# Patient Record
Sex: Female | Born: 1955
Health system: Southern US, Community
[De-identification: ages and names within clinical notes are randomized; demographics above are authoritative.]

## PROBLEM LIST (undated history)

## (undated) DIAGNOSIS — E079 Disorder of thyroid, unspecified: Secondary | ICD-10-CM

## (undated) DIAGNOSIS — R61 Generalized hyperhidrosis: Secondary | ICD-10-CM

## (undated) DIAGNOSIS — K219 Gastro-esophageal reflux disease without esophagitis: Secondary | ICD-10-CM

## (undated) DIAGNOSIS — H269 Unspecified cataract: Secondary | ICD-10-CM

## (undated) DIAGNOSIS — R002 Palpitations: Secondary | ICD-10-CM

## (undated) DIAGNOSIS — I10 Essential (primary) hypertension: Secondary | ICD-10-CM

## (undated) DIAGNOSIS — M1990 Unspecified osteoarthritis, unspecified site: Secondary | ICD-10-CM

## (undated) DIAGNOSIS — E785 Hyperlipidemia, unspecified: Secondary | ICD-10-CM

## (undated) HISTORY — PX: CATARACT EXTRACTION: SUR2

## (undated) HISTORY — DX: Gastro-esophageal reflux disease without esophagitis: K21.9

## (undated) HISTORY — PX: COLONOSCOPY: SHX174

## (undated) HISTORY — DX: Disorder of thyroid, unspecified: E07.9

## (undated) HISTORY — PX: FOOT SURGERY: SHX648

## (undated) HISTORY — DX: Generalized hyperhidrosis: R61

## (undated) HISTORY — DX: Unspecified osteoarthritis, unspecified site: M19.90

## (undated) HISTORY — DX: Unspecified cataract: H26.9

## (undated) HISTORY — PX: LAPAROSCOPIC OOPHERECTOMY: SHX6507

## (undated) HISTORY — DX: Essential (primary) hypertension: I10

## (undated) HISTORY — DX: Palpitations: R00.2

## (undated) HISTORY — DX: Hyperlipidemia, unspecified: E78.5

---

## 1974-07-28 DIAGNOSIS — E079 Disorder of thyroid, unspecified: Secondary | ICD-10-CM

## 1974-07-28 HISTORY — DX: Disorder of thyroid, unspecified: E07.9

## 1997-11-08 ENCOUNTER — Other Ambulatory Visit: Admission: RE | Admit: 1997-11-08 | Discharge: 1997-11-08 | Payer: Self-pay | Admitting: Obstetrics and Gynecology

## 1997-11-17 ENCOUNTER — Ambulatory Visit (HOSPITAL_COMMUNITY): Admission: RE | Admit: 1997-11-17 | Discharge: 1997-11-17 | Payer: Self-pay | Admitting: Obstetrics and Gynecology

## 1998-11-21 ENCOUNTER — Encounter: Payer: Self-pay | Admitting: Obstetrics and Gynecology

## 1998-11-21 ENCOUNTER — Ambulatory Visit (HOSPITAL_COMMUNITY): Admission: RE | Admit: 1998-11-21 | Discharge: 1998-11-21 | Payer: Self-pay | Admitting: Obstetrics and Gynecology

## 1999-01-10 ENCOUNTER — Other Ambulatory Visit: Admission: RE | Admit: 1999-01-10 | Discharge: 1999-01-10 | Payer: Self-pay | Admitting: Obstetrics and Gynecology

## 1999-11-25 ENCOUNTER — Encounter: Payer: Self-pay | Admitting: Obstetrics and Gynecology

## 1999-11-25 ENCOUNTER — Ambulatory Visit (HOSPITAL_COMMUNITY): Admission: RE | Admit: 1999-11-25 | Discharge: 1999-11-25 | Payer: Self-pay | Admitting: Obstetrics and Gynecology

## 2000-02-14 ENCOUNTER — Other Ambulatory Visit: Admission: RE | Admit: 2000-02-14 | Discharge: 2000-02-14 | Payer: Self-pay | Admitting: Obstetrics and Gynecology

## 2000-12-01 ENCOUNTER — Encounter: Payer: Self-pay | Admitting: Obstetrics and Gynecology

## 2000-12-01 ENCOUNTER — Ambulatory Visit (HOSPITAL_COMMUNITY): Admission: RE | Admit: 2000-12-01 | Discharge: 2000-12-01 | Payer: Self-pay | Admitting: Obstetrics and Gynecology

## 2001-03-11 ENCOUNTER — Other Ambulatory Visit: Admission: RE | Admit: 2001-03-11 | Discharge: 2001-03-11 | Payer: Self-pay | Admitting: Obstetrics and Gynecology

## 2001-12-03 ENCOUNTER — Ambulatory Visit (HOSPITAL_COMMUNITY): Admission: RE | Admit: 2001-12-03 | Discharge: 2001-12-03 | Payer: Self-pay | Admitting: Obstetrics and Gynecology

## 2001-12-03 ENCOUNTER — Encounter: Payer: Self-pay | Admitting: Obstetrics and Gynecology

## 2002-04-25 ENCOUNTER — Other Ambulatory Visit: Admission: RE | Admit: 2002-04-25 | Discharge: 2002-04-25 | Payer: Self-pay | Admitting: Obstetrics and Gynecology

## 2002-12-12 ENCOUNTER — Encounter: Payer: Self-pay | Admitting: Obstetrics and Gynecology

## 2002-12-12 ENCOUNTER — Ambulatory Visit (HOSPITAL_COMMUNITY): Admission: RE | Admit: 2002-12-12 | Discharge: 2002-12-12 | Payer: Self-pay | Admitting: Obstetrics and Gynecology

## 2003-05-02 ENCOUNTER — Other Ambulatory Visit: Admission: RE | Admit: 2003-05-02 | Discharge: 2003-05-02 | Payer: Self-pay | Admitting: Obstetrics and Gynecology

## 2004-05-29 ENCOUNTER — Ambulatory Visit (HOSPITAL_COMMUNITY): Admission: RE | Admit: 2004-05-29 | Discharge: 2004-05-29 | Payer: Self-pay | Admitting: Obstetrics and Gynecology

## 2004-08-27 ENCOUNTER — Other Ambulatory Visit: Admission: RE | Admit: 2004-08-27 | Discharge: 2004-08-27 | Payer: Self-pay | Admitting: Obstetrics and Gynecology

## 2004-10-02 ENCOUNTER — Ambulatory Visit: Payer: Self-pay | Admitting: Internal Medicine

## 2004-10-18 ENCOUNTER — Ambulatory Visit: Payer: Self-pay | Admitting: Internal Medicine

## 2005-05-30 ENCOUNTER — Ambulatory Visit (HOSPITAL_COMMUNITY): Admission: RE | Admit: 2005-05-30 | Discharge: 2005-05-30 | Payer: Self-pay | Admitting: Obstetrics and Gynecology

## 2005-09-22 ENCOUNTER — Other Ambulatory Visit: Admission: RE | Admit: 2005-09-22 | Discharge: 2005-09-22 | Payer: Self-pay | Admitting: Obstetrics and Gynecology

## 2006-06-01 ENCOUNTER — Ambulatory Visit (HOSPITAL_COMMUNITY): Admission: RE | Admit: 2006-06-01 | Discharge: 2006-06-01 | Payer: Self-pay | Admitting: Obstetrics and Gynecology

## 2007-06-09 ENCOUNTER — Ambulatory Visit (HOSPITAL_COMMUNITY): Admission: RE | Admit: 2007-06-09 | Discharge: 2007-06-09 | Payer: Self-pay | Admitting: Obstetrics and Gynecology

## 2008-06-14 ENCOUNTER — Ambulatory Visit (HOSPITAL_COMMUNITY): Admission: RE | Admit: 2008-06-14 | Discharge: 2008-06-14 | Payer: Self-pay | Admitting: Obstetrics and Gynecology

## 2009-06-19 ENCOUNTER — Ambulatory Visit (HOSPITAL_COMMUNITY): Admission: RE | Admit: 2009-06-19 | Discharge: 2009-06-19 | Payer: Self-pay | Admitting: Obstetrics and Gynecology

## 2009-07-16 ENCOUNTER — Emergency Department (HOSPITAL_COMMUNITY): Admission: EM | Admit: 2009-07-16 | Discharge: 2009-07-16 | Payer: Self-pay | Admitting: Family Medicine

## 2009-10-03 ENCOUNTER — Encounter: Payer: Self-pay | Admitting: Internal Medicine

## 2010-01-04 ENCOUNTER — Encounter: Payer: Self-pay | Admitting: Internal Medicine

## 2010-01-17 ENCOUNTER — Telehealth (INDEPENDENT_AMBULATORY_CARE_PROVIDER_SITE_OTHER): Payer: Self-pay | Admitting: *Deleted

## 2010-01-17 ENCOUNTER — Telehealth: Payer: Self-pay | Admitting: Internal Medicine

## 2010-01-31 DIAGNOSIS — D1803 Hemangioma of intra-abdominal structures: Secondary | ICD-10-CM | POA: Insufficient documentation

## 2010-01-31 DIAGNOSIS — E039 Hypothyroidism, unspecified: Secondary | ICD-10-CM | POA: Insufficient documentation

## 2010-01-31 DIAGNOSIS — K5730 Diverticulosis of large intestine without perforation or abscess without bleeding: Secondary | ICD-10-CM | POA: Insufficient documentation

## 2010-01-31 DIAGNOSIS — D376 Neoplasm of uncertain behavior of liver, gallbladder and bile ducts: Secondary | ICD-10-CM

## 2010-02-06 ENCOUNTER — Ambulatory Visit: Payer: Self-pay | Admitting: Internal Medicine

## 2010-02-07 LAB — CONVERTED CEMR LAB
ALT: 16 units/L (ref 0–35)
Alkaline Phosphatase: 62 units/L (ref 39–117)
Bilirubin, Direct: 0.1 mg/dL (ref 0.0–0.3)
Total Protein: 6.8 g/dL (ref 6.0–8.3)

## 2010-02-08 ENCOUNTER — Ambulatory Visit: Payer: Self-pay | Admitting: Internal Medicine

## 2010-02-08 DIAGNOSIS — E538 Deficiency of other specified B group vitamins: Secondary | ICD-10-CM | POA: Insufficient documentation

## 2010-02-15 ENCOUNTER — Ambulatory Visit: Payer: Self-pay | Admitting: Internal Medicine

## 2010-02-25 ENCOUNTER — Ambulatory Visit: Payer: Self-pay | Admitting: Internal Medicine

## 2010-03-04 ENCOUNTER — Ambulatory Visit: Payer: Self-pay | Admitting: Internal Medicine

## 2010-03-28 ENCOUNTER — Telehealth: Payer: Self-pay | Admitting: Internal Medicine

## 2010-04-02 ENCOUNTER — Ambulatory Visit: Payer: Self-pay | Admitting: Internal Medicine

## 2010-04-05 ENCOUNTER — Ambulatory Visit (HOSPITAL_COMMUNITY): Admission: RE | Admit: 2010-04-05 | Discharge: 2010-04-05 | Payer: Self-pay | Admitting: Internal Medicine

## 2010-05-01 ENCOUNTER — Ambulatory Visit: Payer: Self-pay | Admitting: Internal Medicine

## 2010-06-05 ENCOUNTER — Ambulatory Visit: Payer: Self-pay | Admitting: Internal Medicine

## 2010-07-03 ENCOUNTER — Ambulatory Visit: Payer: Self-pay | Admitting: Internal Medicine

## 2010-07-03 ENCOUNTER — Ambulatory Visit (HOSPITAL_COMMUNITY)
Admission: RE | Admit: 2010-07-03 | Discharge: 2010-07-03 | Payer: Self-pay | Source: Home / Self Care | Attending: Obstetrics and Gynecology | Admitting: Obstetrics and Gynecology

## 2010-07-11 ENCOUNTER — Encounter
Admission: RE | Admit: 2010-07-11 | Discharge: 2010-07-11 | Payer: Self-pay | Source: Home / Self Care | Attending: Obstetrics and Gynecology | Admitting: Obstetrics and Gynecology

## 2010-07-12 ENCOUNTER — Encounter
Admission: RE | Admit: 2010-07-12 | Discharge: 2010-07-12 | Payer: Self-pay | Source: Home / Self Care | Attending: Obstetrics and Gynecology | Admitting: Obstetrics and Gynecology

## 2010-07-28 HISTORY — PX: BREAST EXCISIONAL BIOPSY: SUR124

## 2010-07-28 HISTORY — PX: BREAST BIOPSY: SHX20

## 2010-08-07 ENCOUNTER — Ambulatory Visit
Admission: RE | Admit: 2010-08-07 | Discharge: 2010-08-07 | Payer: Self-pay | Source: Home / Self Care | Attending: Internal Medicine | Admitting: Internal Medicine

## 2010-08-07 ENCOUNTER — Other Ambulatory Visit: Payer: Self-pay | Admitting: Internal Medicine

## 2010-08-07 LAB — VITAMIN B12: Vitamin B-12: 386 pg/mL (ref 211–911)

## 2010-08-16 ENCOUNTER — Ambulatory Visit
Admission: RE | Admit: 2010-08-16 | Discharge: 2010-08-16 | Payer: Self-pay | Source: Home / Self Care | Attending: General Surgery | Admitting: General Surgery

## 2010-08-16 ENCOUNTER — Encounter
Admission: RE | Admit: 2010-08-16 | Discharge: 2010-08-16 | Payer: Self-pay | Source: Home / Self Care | Attending: General Surgery | Admitting: General Surgery

## 2010-08-17 ENCOUNTER — Encounter: Payer: Self-pay | Admitting: Obstetrics and Gynecology

## 2010-08-17 NOTE — Op Note (Signed)
  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 Alan Mulder who underwent a screening mammogram on July 03, 2010, showed some left breast microcalcifications.  This area was of 3-mm cluster, a slightly heterogeneous calcifications with possibly a small mass associated with that.  She underwent evaluation with stereotactic biopsy with clip placement showing a focal atypical lobular hyperplasia, benign stromal fibrosis with microcalcifications.  She was then referred for surgical excision.  She and I and Dr. Ashley Royalty discussed a wire localization biopsy.  PROCEDURE:  After informed consent was obtained, the patient first was taken to the breast center.  She had a wire placed by Dr. Vincenza Hews.  She was then brought to [facility].  I had the mammograms available for my review prior to beginning.  She then was placed under monitored anesthesia care.  She underwent administration of 1 g of intravenous cefazolin.  Sequential compression devices were placed on lower extremities.  Her breast was then prepped and draped in standard sterile surgical fashion.  A surgical time-out was then performed.  I made a radial incision as the wire really tracked posterior and a little bit medial from where it went in the lower outer quadrant of her breast.  Cautery was then used to remove the entire lesion.  I did this, and essentially a cylinder around the wire and the clip.  There was a 3- mm focus of atypical lobular plasia.  This was a very small area, so I did not take a great deal of tissue around this due to her diagnosis at this point.   This was then passed off the table as a specimen.  It was marked with a short stitch superior, long stitch lateral, double stitch deep.  Faxitron mammogram confirmed removal of the clip.  Dr. Guinevere Ferrari discussed this with me.  One of the margins around the clip was about 8 mm, an I think that is perfectly adequate for what is a benign diagnosis, so we did not proceed with any other excision.  Hemostasis was observed.  Irrigation was performed.  I then closed the deep breast tissue with a 2-0 Vicryl.  The dermis was closed with 3-0 Vicryl.  The skin was closed with 4-0 Monocryl in a subcuticular fashion.  Steri- Strips and sterile dressing were placed.  I used a total of about 18 mL of 0.25% Marcaine mixed with 1% lidocaine during the procedure.  She tolerated this well, was transferred to the recovery room in stable condition.     Juanetta Gosling, MD     MCW/MEDQ  D:  08/16/2010  T:  08/17/2010  Job:  [id number]  cc:   Tera Mater. Evlyn Kanner, M.D.  Electronically Signed by Emelia Loron MD on 08/17/2010 01:59:15 PM

## 2010-08-19 LAB — BASIC METABOLIC PANEL
BUN: 15 mg/dL (ref 6–23)
CO2: 26 mEq/L (ref 19–32)
Chloride: 104 mEq/L (ref 96–112)
Creatinine, Ser: 0.84 mg/dL (ref 0.4–1.2)

## 2010-08-19 LAB — CBC
Hemoglobin: 14.3 g/dL (ref 12.0–15.0)
MCH: 31.7 pg (ref 26.0–34.0)
MCV: 94.5 fL (ref 78.0–100.0)
Platelets: 201 10*3/uL (ref 150–400)
RBC: 4.51 MIL/uL (ref 3.87–5.11)

## 2010-08-19 LAB — DIFFERENTIAL
Eosinophils Absolute: 0.1 10*3/uL (ref 0.0–0.7)
Lymphs Abs: 1.9 10*3/uL (ref 0.7–4.0)
Monocytes Absolute: 0.6 10*3/uL (ref 0.1–1.0)
Monocytes Relative: 8 % (ref 3–12)
Neutrophils Relative %: 66 % (ref 43–77)

## 2010-08-27 NOTE — Assessment & Plan Note (Signed)
Summary: b12 week 1 of 4/dn  Nurse Visit    Allergies: No Known Drug Allergies  Medication Administration  Injection # 1:    Medication: Vit B12 1000 mcg    Diagnosis: B12 DEFICIENCY (ICD-266.2)    Route: IM    Site: L deltoid    Exp Date: 10/2011    Lot #: [id number]    Mfr: [employer]    Patient tolerated injection without complications    Given by: Ok Anis CMA (February 08, 2010 1:42 PM)  Orders Added: 1)  Vit B12 1000 mcg [J3420]

## 2010-08-27 NOTE — Progress Notes (Signed)
Summary: F/U MRI FOR LIVER LESION  Phone Note Outgoing Call   Call placed by: Lamona Curl CMA Duncan Dull),  March 28, 2010 11:43 AM Call placed to: Patient Summary of Call: Called patient to advise her that I have scheduled her for a follow up MRI abdomen with and without Evista contrast (evista to be used per Dr Juanda Chance after conversation with Radiologist at patient's office visit). Patient has been set up to have MRI at [facility] radiology on 04-05-10 @ 9 am. She will come several days before that in order to have her BUN and Creatinine drawn.  Initial call taken by: Lamona Curl CMA (AAMA),  March 28, 2010 11:44 AM

## 2010-08-27 NOTE — Assessment & Plan Note (Signed)
Summary: MONTHLY B12 SHOT...LSW.  Nurse Visit   Allergies: No Known Drug Allergies  Medication Administration  Injection # 1:    Medication: Vit B12 1000 mcg    Diagnosis: B12 DEFICIENCY (ICD-266.2)    Route: IM    Site: L deltoid    Exp Date: 02/26/2012    Lot #: [id number]    Mfr: [employer]    Patient tolerated injection without complications    Given by: Christie Nottingham CMA Duncan Dull) (June 05, 2010 1:43 PM)  Orders Added: 1)  Vit B12 1000 mcg [J3420]

## 2010-08-27 NOTE — Assessment & Plan Note (Signed)
Summary: b12 week 3 of 4  Nurse Visit   Allergies: No Known Drug Allergies  Medication Administration  Injection # 1:    Medication: Vit B12 1000 mcg    Diagnosis: B12 DEFICIENCY (ICD-266.2)    Route: IM    Site: L deltoid    Exp Date: 12/2011    Lot #: [id number]    Mfr: [employer]    Comments: pt to schedule # 4 of 4 at the front desk    Patient tolerated injection without complications    Given by: Chales Abrahams CMA Duncan Dull) (February 25, 2010 3:40 PM)  Orders Added: 1)  Vit B12 1000 mcg [J3420]

## 2010-08-27 NOTE — Assessment & Plan Note (Signed)
Summary: Ref by Dr. Evlyn Kanner, sending recs/dfs   History of Present Illness Visit Type: Initial Visit Primary GI MD: Lina Sar MD Primary Provider: Adrian Prince, MD Chief Complaint: Results of MRI: large left hepatic lobe mass History of Present Illness:   This is a 55 year old white female with an asymptomatic mass in the left lobe of the liver measuring 5x3 cm surrounding the hepatic dome, consistent with small cysts. It showed on an MRI of the abdomen done for evaluation of abdominal pain after an upper abdominal ultrasound that showed an abnormality of the left lobe of the liver. The mass does not appear to be vascular. It does  not enhance. It is not typical of focal nodular hyperplasia or metastatic disease. It has sharply defined edges and appears somewhat lobular. Patient denies abdominal pain at this time. Her liver function tests have been normal. She is postmenopausal and is not taking any estrogen. She drinks 2-3 glasses of wine a week. Her weight has been stable. A screening colonoscopy in March 2006 showed mild diverticulosis of the left colon with no polyps. There is no family history of liver disease.   GI Review of Systems    Reports abdominal pain.      Denies acid reflux, belching, bloating, chest pain, dysphagia with liquids, dysphagia with solids, heartburn, loss of appetite, nausea, vomiting, vomiting blood, weight loss, and  weight gain.        Denies anal fissure, black tarry stools, change in bowel habit, diarrhea, diverticulosis, fecal incontinence, heme positive stool, hemorrhoids, irritable bowel syndrome, jaundice, light color stool, liver problems, rectal bleeding, and  rectal pain. Preventive Screening-Counseling & Management  Alcohol-Tobacco     Smoking Status: never    Current Medications (verified): 1)  Synthroid 100 Mcg Tabs (Levothyroxine Sodium) .... Take 1 Tablet By Mouth Once A Day 2)  Glucosamine-Chondroitin  Caps (Glucosamine-Chondroit-Vit C-Mn)  .... 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 C-Section Bilateral Tubal Ligation Ovarian Cyst Removal  Family History: No FH of Colon Cancer: Family History of Uterine Cancer:Mother Family History of Heart Disease: Father  Social History: Illicit Drug Use - no Occupation: Stay at home MOM Patient has never smoked.  Alcohol Use - yes Daily Caffeine Use 6 Smoking Status:  never  Review of Systems       The patient complains of arthritis/joint pain and hearing problems.  The patient denies allergy/sinus, anemia, anxiety-new, back pain, blood in urine, breast changes/lumps, change in vision, confusion, cough, coughing up blood, depression-new, fainting, fatigue, fever, headaches-new, heart murmur, heart rhythm changes, itching, menstrual pain, muscle pains/cramps, night sweats, nosebleeds, pregnancy symptoms, shortness of breath, skin rash, sleeping problems, sore throat, swelling of feet/legs, swollen lymph glands, thirst - excessive , urination - excessive , urination changes/pain, urine leakage, vision changes, and voice change.         Pertinent positive and negative review of systems were noted in the above HPI. All other ROS was otherwise negative.   Vital Signs:  Patient profile:   55 year old female Height:      66.5 inches Weight:      153 pounds BMI:     24.41 Pulse rate:   56 / minute Pulse rhythm:   regular BP sitting:   120 / 82  (  left arm) Cuff size:   regular  Vitals Entered By: June McMurray CMA Duncan Dull) (February 06, 2010 8:50 AM)  Physical Exam  General:  healthy-appearing and in no distress. Eyes:  nonicteric. Mouth:  normal oral mucosa. Neck:  Supple; no masses or  thyromegaly. Lungs:  Clear throughout to auscultation. Heart:  Regular rate and rhythm; no murmurs, rubs,  or bruits. Abdomen:  softer relaxed abdomen with normoactive bowel sounds. Liver edge is at the costal margin. Overall span by percussion is about 8 cm. I could not appreciate left lobe of the liver, there is no ascites. There is no bruit. Left upper quadrant is unremarkable. Extremities:  No clubbing, cyanosis, edema or deformities noted. Skin:  no stigmata of chronic liver disease. Psych:  Alert and cooperative. Normal mood and affect.   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 It does not appear to be a hemangioma based on the characteristics of MRI. The possibilites include focal nodular hyperplasia or adenoma. Metastatic disease is less likely. Primary hepatocellular carcinoma is less likely as well. I would plan to obtain a CEA level, alpha-fetoprotein level and repeat the liver function tests. After discussing the results with Dr. Dagoberto Reef, Radiologist, we opted to repeat the MRI in 3 months to assure stability providing the tumor markers are negative. We will plan to use Eovist contrast material to bring out the characteristics of the mass. At this point, I would not recommend biopsying the mass which is most likely benign.  Problem # 2:  DIVERTICULOSIS OF COLON (ICD-562.10) Patient is up-to-date on her colorectal screening. A recall colonoscopy will be due in 2016.  Other Orders: TLB-CEA (Carcinoembryonic Antigen) (82378-CEA) TLB-B12, Serum-Total ONLY (21308-M57) TLB-Folic Acid (Folate) (82746-FOL) T-Alpha-Fetoprotein Serum (84696-29528) TLB-Hepatic/Liver Function Pnl (80076-HEPATIC)  Patient Instructions: 1)  Liver function tests, CEA, alpha-fetoprotein, b12 and folate to be drawn today. 2)  Repeat MRI with Eovist contrast in 3 months to ensure  stability of the liver lesion. If blood tests and MRI ensure stability, I would recommend a repeat the MRI in one year. If not, we will proceed with percutaneous biopsy of the liver mass. 3)  Copy sent to : Dr Kathie Rhodes.[place] 4)  The medication list was reviewed and reconciled.  All changed / newly prescribed medications were explained.  A complete medication list was provided to the patient / caregiver.

## 2010-08-27 NOTE — Assessment & Plan Note (Signed)
Summary: MONTHLY B12 SHOT...LSW.  Nurse Visit   Allergies: No Known Drug Allergies  Medication Administration  Injection # 1:    Medication: Vit B12 1000 mcg    Diagnosis: B12 DEFICIENCY (ICD-266.2)    Route: IM    Site: L deltoid    Exp Date: 7/13    Lot #: [id number]    Mfr: [employer]    Patient tolerated injection without complications    Given by: Lamona Curl CMA (AAMA) (May 01, 2010 1:33 PM)  Orders Added: 1)  Vit B12 1000 mcg [J3420]

## 2010-08-27 NOTE — Assessment & Plan Note (Signed)
Summary: MONTHLY B12 SHOT...LSW.  Nurse Visit   Allergies: No Known Drug Allergies  Medication Administration  Injection # 1:    Medication: Vit B12 1000 mcg    Diagnosis: B12 DEFICIENCY (ICD-266.2)    Route: IM    Site: L deltoid    Exp Date: 10/13    Lot #: [id number]    Mfr: [employer]    Patient tolerated injection without complications    Given by: Milford Cage NCMA (July 03, 2010 2:54 PM)  Orders Added: 1)  Vit B12 1000 mcg [J3420]

## 2010-08-27 NOTE — Assessment & Plan Note (Signed)
Summary: MONTHLY B12 SHOT...LSW.  Nurse Visit   Medication Administration  Injection # 1:    Medication: Vit B12 1000 mcg    Diagnosis: B12 DEFICIENCY (ICD-266.2)    Route: IM    Site: R deltoid    Exp Date: 12/2011    Lot #: [id number]    Mfr: [employer]    Comments: PT WILL RETURN IN OCTOBER FOR NEXT INJECTION    Patient tolerated injection without complications    Given by: Francee Piccolo CMA Duncan Dull) (April 02, 2010 11:43 AM)  Orders Added: 1)  Vit B12 1000 mcg [J3420]

## 2010-08-27 NOTE — Assessment & Plan Note (Signed)
Summary: b12 week 2 of 4/dn  Nurse Visit   Allergies: No Known Drug Allergies  Medication Administration  Injection # 1:    Medication: Vit B12 1000 mcg    Diagnosis: B12 DEFICIENCY (ICD-266.2)    Route: IM    Site: R deltoid    Exp Date: 11/26/2011    Lot #: [id number]    Mfr: [employer]    Patient tolerated injection without complications    Given by: Harlow Mares CMA (AAMA) (February 15, 2010 9:09 AM)  Orders Added: 1)  Vit B12 1000 mcg [J3420]

## 2010-08-27 NOTE — Progress Notes (Signed)
Summary: ASAP Appt.  Phone Note Other Incoming   Caller: Malachi Bonds w/Dr.[facility] [phone] Summary of Call: Pt. had an atypical hemangioma on MRI, pt's husband, Dr.Paladino wants pt. seen ASAP.  Pt. will see [facility] on 01-22-10 at 9am. Malachi Bonds will fax all records. Pt. instructed to call back as needed.  Initial call taken by: Laureen Ochs LPN,  January 17, 2010 12:12 PM

## 2010-08-27 NOTE — Assessment & Plan Note (Signed)
Summary: b12 wk 4 of 4....sched. next b12 1 month out!/dn  Nurse Visit   Allergies: No Known Drug Allergies  Medication Administration  Injection # 1:    Medication: Vit B12 1000 mcg    Diagnosis: B12 DEFICIENCY (ICD-266.2)    Route: IM    Site: L deltoid    Exp Date: 12/2011    Lot #: [id number]    Mfr: [employer]    Comments: pt to schdeule next monthly b12 at the front desk    Patient tolerated injection without complications    Given by: Chales Abrahams CMA Duncan Dull) (March 04, 2010 3:31 PM)  Orders Added: 1)  Vit B12 1000 mcg [J3420]

## 2010-08-27 NOTE — Procedures (Signed)
Summary: COLONOSCOPY   Colonoscopy  Procedure date:  10/18/3004  Findings:      Location:  [facility].     Patient Name: Jessica Herrera, Jessica Herrera. MRN:  Procedure Procedures: Colonoscopy CPT: [phone].  Personnel: Endoscopist: Lequita Meadowcroft L. Juanda Chance, MD.  Referred By: Richardean Chimera, MD.  Exam Location: Exam performed in Outpatient Clinic. Outpatient  Patient Consent: Procedure, Alternatives, Risks and Benefits discussed, consent obtained, from patient. Consent was obtained by the RN.  Indications  Average Risk Screening Routine.  History  Current Medications: Patient is not currently taking Coumadin.  Pre-Exam Physical: Performed Oct 18, 2004. Entire physical exam was normal.  Exam Exam: Extent of exam reached: Cecum, extent intended: Cecum.  The cecum was identified by appendiceal orifice and IC valve. Colon retroflexion performed. Images taken. ASA Classification: I. Tolerance: good.  Monitoring: Pulse and BP monitoring, Oximetry used. Supplemental O2 given.  Colon Prep Used Miralax for colon prep. Prep results: good.  Sedation Meds: Patient assessed and found to be appropriate for moderate (conscious) sedation. Fentanyl 75 mcg. given IV. Versed 9 mg. given IV.  Findings - DIVERTICULOSIS: Descending Colon to Sigmoid Colon. ICD9: Diverticulosis: 562.10.   Assessment Normal examination.  Diagnoses: 562.10: Diverticulosis.   Comments: no polyps, minimal diverticulosis Events  Unplanned Interventions: No intervention was required.  Unplanned Events: There were no complications. Plans Patient Education: Patient given standard instructions for: Yearly hemoccult testing recommended. Patient instructed to get routine colonoscopy every 10 years.  Disposition: After procedure patient sent to recovery. After recovery patient sent home.    cc: Ainsley Spinner  This report was created from the original endoscopy report, which was reviewed and signed by the  above listed endoscopist.

## 2010-08-27 NOTE — Progress Notes (Signed)
Summary: Appt change.  Phone Note Call from Patient   Caller: Spouse Summary of Call: Pt's husband calling. Dr. Darrin Nipper,  He would like for his wife to see Dr. Juanda Chance.  Feels this is not an emergency and wants to  cancel appt with Amy Esterwood. PA.  Would like to be seen after the fourth of July.  Appt sch for July 13 with Dr. Juanda Chance.   Initial call taken by: Ashok Cordia RN,  January 17, 2010 12:30 PM

## 2010-08-29 NOTE — Assessment & Plan Note (Signed)
Summary: PLEASE HAVE PT TO GET LABS FIRST (DNS)! MONTHLY B12 SHOT...LSW.  Nurse Visit   Allergies: No Known Drug Allergies  Medication Administration  Injection # 1:    Medication: Vit B12 1000 mcg    Diagnosis: B12 DEFICIENCY (ICD-266.2)    Route: IM    Site: R deltoid    Exp Date: 04/27/2012    Lot #: [id number]    Mfr: [employer]    Patient tolerated injection without complications    Given by: Christie Nottingham CMA (AAMA) (August 07, 2010 1:50 PM)  Orders Added: 1)  Vit B12 1000 mcg [J3420]

## 2010-09-04 ENCOUNTER — Encounter (INDEPENDENT_AMBULATORY_CARE_PROVIDER_SITE_OTHER): Payer: BC Managed Care – PPO

## 2010-09-04 ENCOUNTER — Encounter (INDEPENDENT_AMBULATORY_CARE_PROVIDER_SITE_OTHER): Payer: Self-pay | Admitting: *Deleted

## 2010-09-04 DIAGNOSIS — E538 Deficiency of other specified B group vitamins: Secondary | ICD-10-CM

## 2010-09-12 NOTE — Assessment & Plan Note (Signed)
Summary: MONTHLY B12 SHOTS...LSW/Dr. Juanda Chance  Nurse Visit   Allergies: No Known Drug Allergies  Medication Administration  Injection # 1:    Medication: Vit B12 1000 mcg    Diagnosis: B12 DEFICIENCY (ICD-266.2)    Route: IM    Site: L deltoid    Exp Date: 05/2012    Lot #: [id number]    Mfr: [employer]    Patient tolerated injection without complications    Given by: Milford Cage NCMA (September 04, 2010 2:56 PM)  Orders Added: 1)  Vit B12 1000 mcg [J3420]

## 2010-10-09 ENCOUNTER — Encounter (INDEPENDENT_AMBULATORY_CARE_PROVIDER_SITE_OTHER): Payer: BC Managed Care – PPO

## 2010-10-09 ENCOUNTER — Encounter: Payer: Self-pay | Admitting: Internal Medicine

## 2010-10-09 DIAGNOSIS — E538 Deficiency of other specified B group vitamins: Secondary | ICD-10-CM

## 2010-10-15 NOTE — Assessment & Plan Note (Signed)
Summary: MONTHLY B12 SHOTS...LSW  Nurse Visit   Allergies: No Known Drug Allergies  Medication Administration  Injection # 1:    Medication: Vit B12 1000 mcg    Diagnosis: B12 DEFICIENCY (ICD-266.2)    Route: IM    Site: L deltoid    Exp Date: 05/28/2012    Lot #: [id number]    Mfr: [employer]    Patient tolerated injection without complications    Given by: Christie Nottingham CMA Duncan Dull) (October 09, 2010 1:36 PM)  Orders Added: 1)  Vit B12 1000 mcg [J3420]

## 2010-11-13 ENCOUNTER — Ambulatory Visit (INDEPENDENT_AMBULATORY_CARE_PROVIDER_SITE_OTHER): Payer: BC Managed Care – PPO | Admitting: Internal Medicine

## 2010-11-13 DIAGNOSIS — E538 Deficiency of other specified B group vitamins: Secondary | ICD-10-CM

## 2010-11-13 MED ORDER — CYANOCOBALAMIN 1000 MCG/ML IJ SOLN
1000.0000 ug | INTRAMUSCULAR | Status: AC
  Administered 2010-11-13 – 2011-01-14 (×2): 1000 ug via INTRAMUSCULAR

## 2010-12-18 ENCOUNTER — Ambulatory Visit (INDEPENDENT_AMBULATORY_CARE_PROVIDER_SITE_OTHER): Payer: BC Managed Care – PPO | Admitting: Internal Medicine

## 2010-12-18 DIAGNOSIS — E538 Deficiency of other specified B group vitamins: Secondary | ICD-10-CM

## 2011-01-14 ENCOUNTER — Ambulatory Visit (INDEPENDENT_AMBULATORY_CARE_PROVIDER_SITE_OTHER): Payer: BC Managed Care – PPO | Admitting: Internal Medicine

## 2011-01-14 DIAGNOSIS — E538 Deficiency of other specified B group vitamins: Secondary | ICD-10-CM

## 2011-01-22 ENCOUNTER — Telehealth (INDEPENDENT_AMBULATORY_CARE_PROVIDER_SITE_OTHER): Payer: Self-pay | Admitting: General Surgery

## 2011-01-22 NOTE — Telephone Encounter (Signed)
Pt called to request order for mammogram/states she believes Dr. Dwain Sarna wanted to recheck six months after surgery.

## 2011-01-27 ENCOUNTER — Telehealth (INDEPENDENT_AMBULATORY_CARE_PROVIDER_SITE_OTHER): Payer: Self-pay

## 2011-01-27 NOTE — Telephone Encounter (Signed)
Message copied by Ethlyn Gallery on Mon Jan 27, 2011  3:14 PM ------      Message from: Delcie Roch      Created: Wed Jan 22, 2011  4:10 PM      Regarding: Drs order for mammogram      Contact: [phone]       Elease Hashimoto:            SORRY!  I'm not sure which method to send things.  Jessica Herrera called to request an order for a six-month recheck mammogram. She thinks Dr. Dwain Sarna wanted one six months after her surgery.              I sent you a message using the telephone call method - don't like it.            Thanks.      Beth

## 2011-01-28 ENCOUNTER — Telehealth (INDEPENDENT_AMBULATORY_CARE_PROVIDER_SITE_OTHER): Payer: Self-pay

## 2011-01-28 NOTE — Telephone Encounter (Signed)
LMOM to notify pt that I did receive her message and that I will work on getting her scheduled for her 6 month lt breast mgm w/appt. With Dr Dwain Sarna. [facility] 01-28-11

## 2011-01-30 ENCOUNTER — Other Ambulatory Visit (INDEPENDENT_AMBULATORY_CARE_PROVIDER_SITE_OTHER): Payer: Self-pay

## 2011-01-30 DIAGNOSIS — R928 Other abnormal and inconclusive findings on diagnostic imaging of breast: Secondary | ICD-10-CM

## 2011-01-30 DIAGNOSIS — N6039 Fibrosclerosis of unspecified breast: Secondary | ICD-10-CM

## 2011-02-03 ENCOUNTER — Other Ambulatory Visit (INDEPENDENT_AMBULATORY_CARE_PROVIDER_SITE_OTHER): Payer: BC Managed Care – PPO

## 2011-02-03 ENCOUNTER — Telehealth: Payer: Self-pay | Admitting: *Deleted

## 2011-02-03 ENCOUNTER — Other Ambulatory Visit: Payer: Self-pay | Admitting: Internal Medicine

## 2011-02-03 DIAGNOSIS — E538 Deficiency of other specified B group vitamins: Secondary | ICD-10-CM

## 2011-02-03 NOTE — Telephone Encounter (Signed)
Spoke with patient and she will come for labs. 

## 2011-02-03 NOTE — Telephone Encounter (Signed)
Message copied by Daphine Deutscher on Mon Feb 03, 2011 10:05 AM ------      Message from: Daphine Deutscher      Created: Wed Oct 02, 2010 11:37 AM       Vit B 12 level due this week call and remind her

## 2011-02-04 ENCOUNTER — Telehealth: Payer: Self-pay | Admitting: *Deleted

## 2011-02-04 DIAGNOSIS — E538 Deficiency of other specified B group vitamins: Secondary | ICD-10-CM

## 2011-02-04 NOTE — Telephone Encounter (Signed)
Message copied by Daphine Deutscher on Tue Feb 04, 2011 10:18 AM ------      Message from: Hart Carwin      Created: Mon Feb 03, 2011  9:57 PM       Please call pt with normal B12 level. May stop B12 supplements, recheck B12 in 6 months.

## 2011-02-04 NOTE — Telephone Encounter (Signed)
Left a message for patient to call me. 

## 2011-02-04 NOTE — Telephone Encounter (Signed)
Spoke with patient and gave her the results. Lab in EP[facility] for 08/07/11. Note to remind patient.

## 2011-02-05 ENCOUNTER — Ambulatory Visit
Admission: RE | Admit: 2011-02-05 | Discharge: 2011-02-05 | Disposition: A | Discharge status: Home / Self Care | Payer: BC Managed Care – PPO | Source: Ambulatory Visit | Attending: General Surgery | Admitting: General Surgery

## 2011-02-05 DIAGNOSIS — R928 Other abnormal and inconclusive findings on diagnostic imaging of breast: Secondary | ICD-10-CM

## 2011-02-05 DIAGNOSIS — N6039 Fibrosclerosis of unspecified breast: Secondary | ICD-10-CM

## 2011-02-06 ENCOUNTER — Telehealth (INDEPENDENT_AMBULATORY_CARE_PROVIDER_SITE_OTHER): Payer: Self-pay

## 2011-02-06 NOTE — Telephone Encounter (Signed)
Pt notified of her 25mo. Mgm to be normal and advised to get bilateral mgm in December 2012. Pt has a f/u appt. W/Dr Dwain Sarna on 03-04-11 for her 6 mo.check up/ [facility] 02-06-11

## 2011-03-04 ENCOUNTER — Encounter (INDEPENDENT_AMBULATORY_CARE_PROVIDER_SITE_OTHER): Payer: Self-pay | Admitting: General Surgery

## 2011-03-04 ENCOUNTER — Ambulatory Visit (INDEPENDENT_AMBULATORY_CARE_PROVIDER_SITE_OTHER): Payer: Commercial Managed Care - PPO | Admitting: General Surgery

## 2011-03-04 VITALS — BP 130/90 | HR 62 | Temp 97.2°F

## 2011-03-04 DIAGNOSIS — N62 Hypertrophy of breast: Secondary | ICD-10-CM

## 2011-03-04 DIAGNOSIS — N6099 Unspecified benign mammary dysplasia of unspecified breast: Secondary | ICD-10-CM

## 2011-03-04 NOTE — Progress Notes (Signed)
Subjective:     Patient ID: Jessica Herrera, female   DOB: 10-15-1955, 55 y.o.   MRN: [id number]  HPI This is a 55 year old female who underwent a left breast biopsy in January for a mammographic finding. Her pathology showed atypical lobular hyperplasia. She's done well since then without any complaints. She had a 6 month followup mammogram on that side recently which showed no changes and no residual calcifications. She comes in today just for followup after that mammogram. She reports no breast masses or no other concerning areas with her breasts today.  Review of Systems     Objective:   Physical Exam  Constitutional: She appears well-developed and well-nourished.  Pulmonary/Chest: Right breast exhibits no inverted nipple, no mass, no nipple discharge, no skin change and no tenderness. Left breast exhibits no inverted nipple, no mass, no nipple discharge, no skin change and no tenderness. Breasts are symmetrical.    Lymphadenopathy:    She has no cervical adenopathy.       Assessment:     ALH left breast    Plan:     She is a normal 6 month followup mammogram as well as no abnormalities on her exam. I think she should continue her screening with her annual mammograms which will get back on schedule in December, self exams every month, and a clinical exam yearly.  She does have atypical lobular hyperplasia which places her at increased risk over every other woman her same age for breast cancer on either side for the remainder of her life. I recommended to her again today for her to see Dr. Park Breed in the risk reduction clinic at the cancer center she is agreeable to that.  She would like to have her clinical exams with Dr. Legrand Pitts yearly and I asked her to come back and see me as needed.

## 2011-06-03 ENCOUNTER — Other Ambulatory Visit: Payer: Self-pay | Admitting: Obstetrics and Gynecology

## 2011-06-03 DIAGNOSIS — Z87898 Personal history of other specified conditions: Secondary | ICD-10-CM

## 2011-06-03 DIAGNOSIS — Z9889 Other specified postprocedural states: Secondary | ICD-10-CM

## 2011-06-12 ENCOUNTER — Ambulatory Visit: Payer: Self-pay | Admitting: Oncology

## 2011-06-12 ENCOUNTER — Ambulatory Visit: Payer: Self-pay

## 2011-06-13 ENCOUNTER — Ambulatory Visit: Payer: Self-pay | Admitting: Oncology

## 2011-06-16 ENCOUNTER — Ambulatory Visit: Payer: Self-pay | Admitting: Oncology

## 2011-07-02 ENCOUNTER — Ambulatory Visit
Admission: RE | Admit: 2011-07-02 | Discharge: 2011-07-02 | Disposition: A | Discharge status: Home / Self Care | Payer: 59 | Source: Ambulatory Visit | Attending: Obstetrics and Gynecology | Admitting: Obstetrics and Gynecology

## 2011-07-02 DIAGNOSIS — Z9889 Other specified postprocedural states: Secondary | ICD-10-CM

## 2011-07-02 DIAGNOSIS — Z87898 Personal history of other specified conditions: Secondary | ICD-10-CM

## 2011-07-18 ENCOUNTER — Telehealth (INDEPENDENT_AMBULATORY_CARE_PROVIDER_SITE_OTHER): Payer: Self-pay

## 2011-07-18 NOTE — Telephone Encounter (Signed)
Pt notified of mgm to be normal per Dr Dwain Sarna and to f/u in one year with mgm's./ [facility]

## 2011-08-12 ENCOUNTER — Encounter: Payer: 59 | Admitting: Internal Medicine

## 2011-08-12 ENCOUNTER — Encounter (INDEPENDENT_AMBULATORY_CARE_PROVIDER_SITE_OTHER): Payer: 59 | Admitting: Internal Medicine

## 2011-08-12 ENCOUNTER — Ambulatory Visit (INDEPENDENT_AMBULATORY_CARE_PROVIDER_SITE_OTHER): Payer: 59 | Admitting: Internal Medicine

## 2011-08-12 DIAGNOSIS — E538 Deficiency of other specified B group vitamins: Secondary | ICD-10-CM

## 2011-08-12 DIAGNOSIS — Z23 Encounter for immunization: Secondary | ICD-10-CM

## 2011-08-12 MED ORDER — CYANOCOBALAMIN 1000 MCG/ML IJ SOLN
1000.0000 ug | INTRAMUSCULAR | Status: DC
  Administered 2011-08-12: 1000 ug via INTRAMUSCULAR

## 2011-08-13 NOTE — Progress Notes (Signed)
CLOSING ENCOUNTER ALB

## 2011-08-20 ENCOUNTER — Telehealth: Payer: Self-pay | Admitting: Oncology

## 2011-08-20 ENCOUNTER — Ambulatory Visit (HOSPITAL_BASED_OUTPATIENT_CLINIC_OR_DEPARTMENT_OTHER): Payer: 59 | Admitting: Family

## 2011-08-20 VITALS — BP 152/91 | HR 53 | Temp 97.4°F | Ht 66.5 in | Wt 145.6 lb

## 2011-08-20 DIAGNOSIS — N6099 Unspecified benign mammary dysplasia of unspecified breast: Secondary | ICD-10-CM

## 2011-08-20 DIAGNOSIS — N62 Hypertrophy of breast: Secondary | ICD-10-CM

## 2011-08-20 NOTE — Telephone Encounter (Signed)
gve the pt her jan 2014 appt calendar °

## 2011-08-21 ENCOUNTER — Encounter: Payer: Self-pay | Admitting: Family

## 2011-08-21 NOTE — Progress Notes (Signed)
[facility]  High Risk Clinic New Patient Evaluation  Name: Jessica Herrera            Date: 08/21/2011 MRN: [id number]                DOB: 1956-07-16  CC: Emelia Loron, MD  REFERRING PHYSICIAN: Emelia Loron, MD  REASON FOR VISIT: Recent diagnosis of atypical lobular hyperplasia, referred for assessment of high-risk breast cancer.  HISTORY OF PRESENT ILLNESS: 56 y.o. female here for cancer risk assessment. Head 3 mm cluster of indeterminate left breast calcifications on mammogram December 2011 underwent stereotactic guided biopsy of the left breast 07/12/2010 which showed atypical lobular hyperplasia. Needle localized lumpectomy of the left breast 08/16/2010 (path [id number]) showed focal atypical lobular hyperplasia, 0.1 cm from lateral margin. No invasive carcinoma identified. Mammography 02/05/11 done status post lumpectomy, six-month followup showed no mammographic evidence for malignancy with bilateral diagnostic mammogram recommended December 2012 to reestablish annual schedule.  No prior thoracic irradiation before age 40. This was her first and only breast biopsy.  PAST MEDICAL HISTORY:  Menopausal night sweats, arthritis, thyroid disease (1976), liver hemangioma, B12 deficiency, diverticulitis.  PAST SURGICAL HISTORY:  Past Surgical History  Procedure Date  . Cesarean section   . Foot surgery     right  . Breast biopsy 2012    left      CURRENT MEDICATIONS: Vitamin B12 injection, Synthroid.  ALLERGIES: No known allergies.  SOCIAL HISTORY: Has never smoked. Spouse is a physician, she does not work outside the home. Drinks wine twice weekly.   HEALTH HABITS: Vitamins: Multivitamin.  Supplements: No Alternative Therapies: No Adverse environmental exposure: No Servings of fruit and vegetables/day: 4-5 Servings of meat/day: 2  REPRODUCTIVE HISTORY:  Menarche age:35 Gravida: 3      Para: 3 First Live Birth: 10 Took fertility meds: N   Menses: Ceased age 38 Menopause: natural  Age 19 HRT No   FAMILY HISTORY:  Mother: uterine, maternal grandmother: leukemia, maternal cousin: testicular, maternal uncle: lymphoma. No breast cancer or ovarian cancer.   HEALTH MAINTENANCE: Last mammogram: July 2012 Last clinical breast exam: August 2012 Performs self breast exam: Yes   REVIEW OF SYSTEMS:  General: Negative for fever, chills, night sweats,  loss of appetite or weight loss. HEENT: Negative for headaches, sore  throat, difficulty swallowing, blurred vision or problem with hearing or  sinus congestion. Respiratory: Negative for shortness of breath, cough  or dyspnea on exertion. Cardiovascular: Negative for chest pain,  palpitations or pedal edema. GI: Negative for nausea, vomiting,  diarrhea, constipation, change in bowel habits or blood in the stool.  No jaundice. GU: Negative for painful or frequent urination, change in  color of urine, or decreased urinary stream. Integumentary: Negative  for skin rashes or other suspicious skin lesions. Hematologic: Negative  for easy bruisability or bleeding. Musculoskeletal: Negative for  complaints of pain, arthralgias, arthritis or myalgias.  Neurological/psychiatric: Negative for numbness, focal weakness,  balance problems or coordination difficulties. No depression or mood swings. "Foggy thinking" occasionally. Breast: No self detected abnormalities in the breast. No nipple discharge, masses or redness of the skin.   PHYSICAL EXAM: BP 152/91  Pulse 53  Temp 97.4 F (36.3 C)  Ht 5' 6.5" (1.689 m)  Wt 145 lb 9.6 oz (66.044 kg)  BMI 23.15 kg/m2 GENERAL: Well developed, well nourished, in no acute distress.  Psych: Alert and oriented X 3, appropriate mood and affect.   ASSESSMENT: 56 year old female with: 1.  Atypical lobular hyperplasia on biopsy and subsequent lumpectomy January 2012. 2. Dondra Spry model risk of breast cancer 3.3% at 5 years, 18.9% lifetime risk.  PLAN: 1.  She does meet the threshold of the risks for chemoprevention with tamoxifen. She would like to discuss this with her husband and give [place] a call back to let us know the results of that discussion. If she decides to go on tamoxifen. We will be happy to prescribe that for her. 2. I encourage healthy lifestyle with BMI less than 25. She currently meets that guideline with BMI 23.1 and practices a healthy lifestyle. 3  Return to clinic in one year for assessment of change in personal or family history which could ultimately change our recommendation.

## 2012-06-08 ENCOUNTER — Other Ambulatory Visit: Payer: Self-pay | Admitting: Obstetrics and Gynecology

## 2012-06-08 DIAGNOSIS — Z1231 Encounter for screening mammogram for malignant neoplasm of breast: Secondary | ICD-10-CM

## 2012-07-08 ENCOUNTER — Telehealth: Payer: Self-pay | Admitting: Oncology

## 2012-07-08 NOTE — Telephone Encounter (Signed)
S/w the pt and she is aware of her cancelled jan 22nd appt that has been r/s to 08/23/2012@1 :00pm

## 2012-07-16 ENCOUNTER — Ambulatory Visit
Admission: RE | Admit: 2012-07-16 | Discharge: 2012-07-16 | Disposition: A | Discharge status: Home / Self Care | Payer: 59 | Source: Ambulatory Visit | Attending: Obstetrics and Gynecology | Admitting: Obstetrics and Gynecology

## 2012-07-16 DIAGNOSIS — Z1231 Encounter for screening mammogram for malignant neoplasm of breast: Secondary | ICD-10-CM

## 2012-07-28 HISTORY — PX: OTHER SURGICAL HISTORY: SHX169

## 2012-08-18 ENCOUNTER — Ambulatory Visit: Payer: 59 | Admitting: Oncology

## 2012-08-19 ENCOUNTER — Telehealth: Payer: Self-pay | Admitting: *Deleted

## 2012-08-19 ENCOUNTER — Telehealth: Payer: Self-pay | Admitting: Medical Oncology

## 2012-08-19 NOTE — Telephone Encounter (Signed)
Patient called and wanted to cancel her appt for Monday. Patient stated that she will call back to reschedule. I went to cancel appt and appt note stated "high risk". I had patient speak with desk "RN first.  JMW

## 2012-08-19 NOTE — Telephone Encounter (Signed)
Patient called to cancel upcoming "wellness appt" sched with MD 08/23/12 states she has to go out of town to help daughter with babysitting, until daughter is able to find a Sales executive, and she isn't sure when she will be able to reschedule appt as now. Informed patient that I will update MD regarding cancellation.

## 2012-08-20 ENCOUNTER — Other Ambulatory Visit: Payer: Self-pay | Admitting: Medical Oncology

## 2012-08-20 ENCOUNTER — Telehealth: Payer: Self-pay | Admitting: Oncology

## 2012-08-20 NOTE — Telephone Encounter (Signed)
Per 1/24 pof 1/27 appt cx'd per pt request.

## 2012-08-23 ENCOUNTER — Ambulatory Visit: Payer: 59 | Admitting: Oncology

## 2012-10-31 SURGERY — SCLERAL BUCKLE
Anesthesia: General | Site: Eye | Laterality: Right

## 2013-02-03 ENCOUNTER — Encounter (INDEPENDENT_AMBULATORY_CARE_PROVIDER_SITE_OTHER): Payer: Self-pay | Admitting: Ophthalmology

## 2013-02-11 ENCOUNTER — Encounter (INDEPENDENT_AMBULATORY_CARE_PROVIDER_SITE_OTHER): Payer: 59 | Admitting: Ophthalmology

## 2013-02-11 ENCOUNTER — Other Ambulatory Visit: Payer: Self-pay | Admitting: Obstetrics and Gynecology

## 2013-02-11 DIAGNOSIS — H33309 Unspecified retinal break, unspecified eye: Secondary | ICD-10-CM

## 2013-02-11 DIAGNOSIS — Z803 Family history of malignant neoplasm of breast: Secondary | ICD-10-CM

## 2013-02-11 DIAGNOSIS — H33009 Unspecified retinal detachment with retinal break, unspecified eye: Secondary | ICD-10-CM

## 2013-04-07 ENCOUNTER — Ambulatory Visit (INDEPENDENT_AMBULATORY_CARE_PROVIDER_SITE_OTHER): Payer: 59 | Admitting: Cardiology

## 2013-04-07 ENCOUNTER — Encounter: Payer: Self-pay | Admitting: Cardiology

## 2013-04-07 VITALS — BP 110/72 | HR 54 | Ht 66.5 in | Wt 144.4 lb

## 2013-04-07 DIAGNOSIS — E538 Deficiency of other specified B group vitamins: Secondary | ICD-10-CM

## 2013-04-07 DIAGNOSIS — R002 Palpitations: Secondary | ICD-10-CM

## 2013-04-07 DIAGNOSIS — R42 Dizziness and giddiness: Secondary | ICD-10-CM

## 2013-04-07 DIAGNOSIS — E039 Hypothyroidism, unspecified: Secondary | ICD-10-CM

## 2013-04-07 NOTE — Assessment & Plan Note (Signed)
stable °

## 2013-04-07 NOTE — Progress Notes (Signed)
HPI:  70 YOWMF with hx of palpitations presents today with increase of palpitations associated with dizziness.  Few rapid beats then pause.  It occurs when she wakes up and most of the day.  No chest pain, no shortness of breath.  No syncope.  The lightheadedness associated with the palpitations is new.  She has been under a lot of stress as well with her parents in poor health and moving to [place].    She has hx of hypothyroidism followed closely by Dr. Evlyn Kanner.  No other medical issues.  No Known Allergies  Current Outpatient Prescriptions  Medication Sig Dispense Refill  . ibuprofen (ADVIL,MOTRIN) 200 MG tablet Take 200 mg by mouth every 6 (six) hours as needed for pain.      Marland Kitchen SYNTHROID 100 MCG tablet 1 mcg Daily.       Current Facility-Administered Medications  Medication Dose Route Frequency Provider Last Rate Last Dose  . cyanocobalamin ((VITAMIN B-12)) injection 1,000 mcg  1,000 mcg Intramuscular Q30 days Hart Carwin, MD   1,000 mcg at 08/12/11 1439    Past Medical History  Diagnosis Date  . Night sweats   . Arthritis     wrists  . Thyroid disease 1976  . Palpitations     Past Surgical History  Procedure Laterality Date  . Cesarean section    . Foot surgery      right  . Breast biopsy  2012    left  . Detached retina repair  2014    Family History  Problem Relation Age of Onset  . Cancer Mother     uterine  . Other Father      heart bypass   . Heart disease Father     History   Social History  . Marital Status: Married    Spouse Name: N/A    Number of Children: 3  . Years of Education: N/A   Occupational History  .     Social History Main Topics  . Smoking status: Never Smoker   . Smokeless tobacco: Not on file  . Alcohol Use: Yes  . Drug Use: No  . Sexual Activity: Yes   Other Topics Concern  . Not on file   Social History Narrative  . No narrative on file    ZOX:WRUEAVW:UJ colds or fevers, no weight changes Skin:no rashes  or ulcers HEENT:no blurred vision, no congestion CV:see HPI  She also stated her HR is normally in the 40-50s PUL:see HPI GI:no diarrhea constipation or melena, no indigestion GU:no hematuria, no dysuria MS:no joint pain, no claudication Neuro:no syncope, + lightheadedness Endo:no diabetes, no thyroid disease   PHYSICAL EXAM BP 110/72  Pulse 54  Ht 5' 6.5" (1.689 m)  Wt 144 lb 6.4 oz (65.499 kg)  BMI 22.96 kg/m2 General:Pleasant affect, NAD Skin:Warm and dry, brisk capillary refill HEENT:normocephalic, sclera injected R> Lt, mucus membranes moist Neck:supple, no JVD, no bruits  Heart:S1S2 RRR without murmur, gallup, rub or click Lungs:clear without rales, rhonchi, or wheezes WJX:BJYN, non tender, + BS, do not palpate liver spleen or masses Ext:no lower ext edema, 2+ pedal pulses, 2+ radial pulses Neuro:alert and oriented, MAE, follows commands, + facial symmetry  EKG: S. Brady no acute changes, Qtc is 447  ASSESSMENT AND PLAN: Heart palpitations, with dizziness Irregular heart rate, short bursts of fast beats and then pauses.  Associated with dizziness.  Occuring over last 2 weeks.  No chest pain.  No SOB.  HYPOTHYROIDISM Followed  closely by Dr. Evlyn Kanner.  B12 DEFICIENCY stable  Will add event monitor for 30 days to evaluate rhythm.  She will call if symptoms increase.  She will follow up with Dr. Tresa Endo in 2-3 weeks.  Dr. Allyson Sabal did review her symptoms and EKG and discussed with the pt and her husband.

## 2013-04-07 NOTE — Assessment & Plan Note (Signed)
Irregular heart rate, short bursts of fast beats and then pauses.  Associated with dizziness.  Occuring over last 2 weeks.  No chest pain.  No SOB.

## 2013-04-07 NOTE — Assessment & Plan Note (Signed)
Followed closely by Dr. Evlyn Kanner.

## 2013-04-07 NOTE — Patient Instructions (Signed)
We will have you wear an event monitor for 30 days.  Call if symptoms increase.   We will have you follow up with Dr. Tresa Endo  In 2-3 weeks.

## 2013-04-21 ENCOUNTER — Ambulatory Visit (INDEPENDENT_AMBULATORY_CARE_PROVIDER_SITE_OTHER): Payer: 59 | Admitting: Cardiovascular Disease

## 2013-04-21 VITALS — BP 100/80 | HR 53 | Ht 66.0 in | Wt 144.3 lb

## 2013-04-21 DIAGNOSIS — R002 Palpitations: Secondary | ICD-10-CM

## 2013-04-21 DIAGNOSIS — E039 Hypothyroidism, unspecified: Secondary | ICD-10-CM

## 2013-04-21 MED ORDER — METOPROLOL TARTRATE 25 MG PO TABS: ORAL_TABLET | ORAL | Status: DC

## 2013-04-21 NOTE — Patient Instructions (Addendum)
Your physician has recommended you make the following change in your medication: start lopressor 25 mg as needed. Prescription has already been sent to the pharmacy.  You can discontinue wearing the cardionet monitor.  Your physician has requested that you have an echocardiogram. Echocardiography is a painless test that uses sound waves to create images of your heart. It provides your doctor with information about the size and shape of your heart and how well your heart's chambers and valves are working. This procedure takes approximately one hour. There are no restrictions for this procedure.  Your physician recommends that you schedule a follow-up appointment in: 1 YEAR  Your physician recommends that you return to your PCP to get labwork.

## 2013-04-26 ENCOUNTER — Encounter: Payer: Self-pay | Admitting: Cardiovascular Disease

## 2013-04-26 NOTE — Progress Notes (Signed)
Patient ID: Jessica Herrera, female   DOB: 1956-01-31, 57 y.o.   MRN: [id number]     HPI: Jessica Herrera, is a 57 y.o. female who is seen for followup evaluation of palpitations. I had seen the patient remotely in the past many years ago. Her husband, Dr. Ashley Herrera called me on 04/07/13  when she was noticing an irregular heart rate. At that time I was in the hospital but arranged for her to see Jessica Herrera in the office that day for evaluation. The patient does have a history of hypothyroidism for which she's been followed by Dr. Evlyn Herrera.  She developed hyperthyroidism while in college and was treated with radioactive iodine in 1975. She has been on thyroid replacement since that time. When the patient was seen in the office she described occasional irregular heartbeats that were short bursts of fast beats and then pauses. It was associated with dizziness. She was referred to wear a CardioNet monitor. A CardioNet monitor was reviewed. This shows predominant sinus rhythm. She has had episodes of isolated PACs, rare PVCs, and was noted to have several short bursts of atrial runs. He also did have episodes of sinus bradycardia with heart rates in the 40s and in one instance 39 beats per minute while sleeping at 3:46 AM. At the time of her palpitations, she did admit to being under significant increased stress. She was dealing with issues with her parents were moving to the area. She also is complaining waiting for her daughter. She does routinely exercise. She denies any exercise-induced arrhythmia.  She denies any episodes of chest pressure. She presents for followup evaluation.  Past Medical History  Diagnosis Date  . Night sweats   . Arthritis     wrists  . Thyroid disease 1976  . Palpitations     Past Surgical History  Procedure Laterality Date  . Cesarean section    . Foot surgery      right  . Breast biopsy  2012    left  . Detached retina repair  2014    No Known Allergies  Current  Outpatient Prescriptions  Medication Sig Dispense Refill  . ibuprofen (ADVIL,MOTRIN) 200 MG tablet Take 200 mg by mouth every 6 (six) hours as needed for pain.      Marland Kitchen SYNTHROID 100 MCG tablet 1 mcg Daily.      . metoprolol tartrate (LOPRESSOR) 25 MG tablet Take 1/2 to 1 prn palpitations  30 tablet  6   Current Facility-Administered Medications  Medication Dose Route Frequency Provider Last Rate Last Dose  . cyanocobalamin ((VITAMIN B-12)) injection 1,000 mcg  1,000 mcg Intramuscular Q30 days Jessica Carwin, MD   1,000 mcg at 08/12/11 1439    History   Social History  . Marital Status: Married    Spouse Name: N/A    Number of Children: 3  . Years of Education: N/A   Occupational History  .     Social History Main Topics  . Smoking status: Never Smoker   . Smokeless tobacco: Not on file  . Alcohol Use: Yes  . Drug Use: No  . Sexual Activity: Yes   Other Topics Concern  . Not on file   Social History Narrative  . No narrative on file    Family History  Problem Relation Age of Onset  . Cancer Mother     uterine  . Other Father      heart bypass   . Heart disease Father  Socially, she is the wife of Jessica Herrera who is an ophthalmologist in town. He has 3 children per she does exercise regularly. There is no tobacco history.  ROS is negative for fevers, chills or night sweats. She denies visual symptoms. She denies tremors. She denies weight loss. She denies wheezing. There is no chest pain. Her symptoms have improved. She denies nausea vomiting or diarrhea. She denies location. There is no edema. She denies paresthesias. She does have arthritis of her wrists. She also takes Synthroid 100 mcg for hypothyroidism status post radioactive iodine in therapy for hyperthyroidism which developed in college.   Other system review is negative.  PE BP 100/80  Pulse 53  Ht 5\' 6"  (1.676 m)  Wt 144 lb 4.8 oz (65.454 kg)  BMI 23.3 kg/m2  General: Alert, oriented, no distress.    Skin: normal turgor, no rashes HEENT: Normocephalic, atraumatic. Pupils round and reactive; sclera anicteric;no lid lag.  Nose without nasal septal hypertrophy Mouth/Parynx benign; Mallinpatti scale 2 Neck: No JVD, no carotid briuts Lungs: clear to ausculatation and percussion; no wheezing or rales Heart: RRR, s1 s2 normal intermittent click. Faint systolic murmur. Abdomen: soft, nontender; no hepatosplenomehaly, BS+; abdominal aorta nontender and not dilated by palpation. Pulses 2+ Extremities: no clubbing cyanosis or edema, Homan's sign negative  Neurologic: grossly nonfocal  ECG: Sinus bradycardia 53 beats per minute. QTc interval 463 ms.  LABS:  BMET    Component Value Date/Time   NA 140 08/14/2010 1144   K 4.3 08/14/2010 1144   CL 104 08/14/2010 1144   CO2 26 08/14/2010 1144   GLUCOSE 105* 08/14/2010 1144   BUN 15 08/14/2010 1144   CREATININE 0.84 08/14/2010 1144   CALCIUM 9.6 08/14/2010 1144   GFRNONAA >60 08/14/2010 1144   GFRAA  Value: >60        The eGFR has been calculated using the MDRD equation. This calculation has not been validated in all clinical situations. eGFR's persistently <60 mL/min signify possible Chronic Kidney Disease. 08/14/2010 1144     Hepatic Function Panel     Component Value Date/Time   PROT 6.8 02/06/2010 1014   ALBUMIN 4.0 02/06/2010 1014   AST 19 02/06/2010 1014   ALT 16 02/06/2010 1014   ALKPHOS 62 02/06/2010 1014   BILITOT 0.6 02/06/2010 1014   BILIDIR 0.1 02/06/2010 1014     CBC    Component Value Date/Time   WBC 7.8 08/14/2010 1144   RBC 4.51 08/14/2010 1144   HGB 14.3 08/14/2010 1144   HCT 42.6 08/14/2010 1144   PLT 201 08/14/2010 1144   MCV 94.5 08/14/2010 1144   MCH 31.7 08/14/2010 1144   MCHC 33.6 08/14/2010 1144   RDW 13.8 08/14/2010 1144   LYMPHSABS 1.9 08/14/2010 1144   MONOABS 0.6 08/14/2010 1144   EOSABS 0.1 08/14/2010 1144   BASOSABS 0.0 08/14/2010 1144     BNP No results found for this basename: probnp    Lipid Panel  No  results found for this basename: chol, trig, hdl, cholhdl, vldl, ldlcalc     RADIOLOGY: No results found.    ASSESSMENT AND PLAN: My impression is that Jessica Herrera is a very pleasant healthy-appearing 57 year old female who is been on thyroid replacement following radioactive iodine treatment of hyperthyroidism while in college. Recently she has been under increased stress. CardioNet monitoring has detected predominant atrial premature contractions but she did have rare PVCs, and several short bursts of nonsustained atrial runs the she was given a prescription  for metoprolol tartrate 5 mg to take on a when necessary basis. She also does have baseline bradycardia in particular during sleep and felt more profound bradycardia to a heart rate of 39. She denies any history suggestive of obstructive sleep apnea. I have recommended reduction of caffeine intake. I have recommended laboratory be checked particularly with reference to thyroid function to make certain her thyroid dose does not need to be altered. I am scheduling her for a 2-D echo Doppler study. I will contact her regarding the results. As long as she remains stable I will see her in one year for followup evaluation.     Lennette Bihari, MD, [facility]  04/26/2013 8:46 PM

## 2013-05-11 ENCOUNTER — Ambulatory Visit (HOSPITAL_COMMUNITY)
Admission: RE | Admit: 2013-05-11 | Discharge: 2013-05-11 | Disposition: A | Discharge status: Home / Self Care | Payer: 59 | Source: Ambulatory Visit | Attending: Cardiology | Admitting: Cardiology

## 2013-05-11 DIAGNOSIS — R002 Palpitations: Secondary | ICD-10-CM | POA: Insufficient documentation

## 2013-05-11 DIAGNOSIS — E039 Hypothyroidism, unspecified: Secondary | ICD-10-CM | POA: Insufficient documentation

## 2013-05-11 DIAGNOSIS — I379 Nonrheumatic pulmonary valve disorder, unspecified: Secondary | ICD-10-CM | POA: Insufficient documentation

## 2013-05-11 DIAGNOSIS — I079 Rheumatic tricuspid valve disease, unspecified: Secondary | ICD-10-CM | POA: Insufficient documentation

## 2013-05-11 DIAGNOSIS — I080 Rheumatic disorders of both mitral and aortic valves: Secondary | ICD-10-CM | POA: Insufficient documentation

## 2013-05-11 NOTE — Progress Notes (Signed)
2D Echo Performed 05/11/2013    Destina Mantei, RCS  

## 2013-05-25 ENCOUNTER — Telehealth: Payer: Self-pay | Admitting: Cardiovascular Disease

## 2013-05-25 NOTE — Telephone Encounter (Signed)
Result sent to Burna Mortimer today to call

## 2013-05-25 NOTE — Telephone Encounter (Signed)
Message forwarded to Dr. Kelly/Wanda, CMA.  

## 2013-05-25 NOTE — Telephone Encounter (Signed)
Had Echo test 2 weeks ago-still have not received the results.Would you please give me a call.

## 2013-05-25 NOTE — Telephone Encounter (Signed)
Message was sent to Dr. Tresa Endo per Joice Lofts -triage nurse.

## 2013-05-26 ENCOUNTER — Telehealth: Payer: Self-pay | Admitting: Cardiovascular Disease

## 2013-05-26 NOTE — Telephone Encounter (Signed)
Need results from echo that was done about 2 weeks ago. Called and asked also yesterday.  Needs someone to call asap.

## 2013-05-26 NOTE — Telephone Encounter (Signed)
Message forwarded to W. Waddell, CMA.  

## 2013-05-29 ENCOUNTER — Encounter: Payer: Self-pay | Admitting: *Deleted

## 2013-05-30 NOTE — Progress Notes (Signed)
Quick Note:  Informed patient ECHO results as interpreted per Dr. Tresa Endo. ______

## 2013-06-03 ENCOUNTER — Encounter: Payer: Self-pay | Admitting: Cardiovascular Disease

## 2013-06-13 ENCOUNTER — Other Ambulatory Visit: Payer: Self-pay

## 2013-06-13 DIAGNOSIS — Z1231 Encounter for screening mammogram for malignant neoplasm of breast: Secondary | ICD-10-CM

## 2013-06-13 DIAGNOSIS — Z9889 Other specified postprocedural states: Secondary | ICD-10-CM

## 2013-07-18 ENCOUNTER — Ambulatory Visit: Payer: 59

## 2013-08-24 ENCOUNTER — Ambulatory Visit
Admission: RE | Admit: 2013-08-24 | Discharge: 2013-08-24 | Disposition: A | Discharge status: Home / Self Care | Payer: 59 | Source: Ambulatory Visit

## 2013-08-24 DIAGNOSIS — Z1231 Encounter for screening mammogram for malignant neoplasm of breast: Secondary | ICD-10-CM

## 2013-08-24 DIAGNOSIS — Z9889 Other specified postprocedural states: Secondary | ICD-10-CM

## 2013-09-07 ENCOUNTER — Encounter (INDEPENDENT_AMBULATORY_CARE_PROVIDER_SITE_OTHER): Payer: 59 | Admitting: Ophthalmology

## 2013-09-07 DIAGNOSIS — H2510 Age-related nuclear cataract, unspecified eye: Secondary | ICD-10-CM

## 2013-09-07 DIAGNOSIS — H43819 Vitreous degeneration, unspecified eye: Secondary | ICD-10-CM

## 2013-09-07 DIAGNOSIS — H33009 Unspecified retinal detachment with retinal break, unspecified eye: Secondary | ICD-10-CM

## 2013-09-07 DIAGNOSIS — H33309 Unspecified retinal break, unspecified eye: Secondary | ICD-10-CM

## 2014-03-10 ENCOUNTER — Other Ambulatory Visit (HOSPITAL_COMMUNITY): Payer: Self-pay | Admitting: Obstetrics and Gynecology

## 2014-03-10 DIAGNOSIS — R1013 Epigastric pain: Secondary | ICD-10-CM

## 2014-03-22 ENCOUNTER — Other Ambulatory Visit (HOSPITAL_COMMUNITY): Payer: Self-pay | Admitting: Obstetrics and Gynecology

## 2014-03-22 ENCOUNTER — Ambulatory Visit (HOSPITAL_COMMUNITY)
Admission: RE | Admit: 2014-03-22 | Discharge: 2014-03-22 | Disposition: A | Discharge status: Home / Self Care | Payer: 59 | Source: Ambulatory Visit | Attending: Obstetrics and Gynecology | Admitting: Obstetrics and Gynecology

## 2014-03-22 DIAGNOSIS — R1013 Epigastric pain: Secondary | ICD-10-CM | POA: Insufficient documentation

## 2014-03-22 DIAGNOSIS — K769 Liver disease, unspecified: Secondary | ICD-10-CM | POA: Diagnosis not present

## 2014-03-22 DIAGNOSIS — N949 Unspecified condition associated with female genital organs and menstrual cycle: Secondary | ICD-10-CM | POA: Insufficient documentation

## 2014-03-22 DIAGNOSIS — N72 Inflammatory disease of cervix uteri: Secondary | ICD-10-CM | POA: Insufficient documentation

## 2014-07-18 ENCOUNTER — Other Ambulatory Visit: Payer: Self-pay

## 2014-07-18 DIAGNOSIS — Z1231 Encounter for screening mammogram for malignant neoplasm of breast: Secondary | ICD-10-CM

## 2014-08-30 ENCOUNTER — Ambulatory Visit
Admission: RE | Admit: 2014-08-30 | Discharge: 2014-08-30 | Disposition: A | Discharge status: Home / Self Care | Payer: 59 | Source: Ambulatory Visit

## 2014-08-30 DIAGNOSIS — Z1231 Encounter for screening mammogram for malignant neoplasm of breast: Secondary | ICD-10-CM

## 2014-09-02 ENCOUNTER — Encounter: Payer: Self-pay | Admitting: Internal Medicine

## 2015-03-07 ENCOUNTER — Encounter: Payer: Self-pay | Admitting: Internal Medicine

## 2015-03-27 ENCOUNTER — Other Ambulatory Visit: Payer: Self-pay | Admitting: Obstetrics and Gynecology

## 2015-03-29 LAB — CYTOLOGY - PAP

## 2015-04-09 ENCOUNTER — Telehealth: Payer: Self-pay | Admitting: Genetic Counselor

## 2015-04-09 NOTE — Telephone Encounter (Signed)
GENETIC APPT-S/W PATIENT AND GAVE APPT FOR 09/14 W/KAREN POWELL REFERRING DR. Jenny Reichmann [facility] DX- BRAC TESTING   REFERRAL INFORMATION SCANNED UNDER MEDIA TAB

## 2015-04-10 ENCOUNTER — Telehealth: Payer: Self-pay | Admitting: *Deleted

## 2015-04-10 NOTE — Telephone Encounter (Signed)
PT. HAS HAD A FAMILY EMERGENCY. POF TO SCHEDULING.

## 2015-04-11 ENCOUNTER — Encounter: Payer: 59 | Admitting: Genetic Counselor

## 2015-04-11 ENCOUNTER — Other Ambulatory Visit: Payer: 59

## 2015-08-09 MED FILL — ATORVASTATIN 10 MG TABLET: 10 | 90 days supply | Qty: 90 | Fill #3

## 2015-08-15 ENCOUNTER — Other Ambulatory Visit: Payer: Self-pay

## 2015-08-15 DIAGNOSIS — Z1231 Encounter for screening mammogram for malignant neoplasm of breast: Secondary | ICD-10-CM

## 2015-09-05 ENCOUNTER — Ambulatory Visit
Admission: RE | Admit: 2015-09-05 | Discharge: 2015-09-05 | Disposition: A | Discharge status: Home / Self Care | Payer: 59 | Source: Ambulatory Visit

## 2015-09-05 DIAGNOSIS — Z1231 Encounter for screening mammogram for malignant neoplasm of breast: Secondary | ICD-10-CM

## 2015-09-07 ENCOUNTER — Other Ambulatory Visit: Payer: Self-pay | Admitting: Obstetrics and Gynecology

## 2015-09-07 DIAGNOSIS — R928 Other abnormal and inconclusive findings on diagnostic imaging of breast: Secondary | ICD-10-CM

## 2015-09-13 ENCOUNTER — Ambulatory Visit
Admission: RE | Admit: 2015-09-13 | Discharge: 2015-09-13 | Disposition: A | Discharge status: Home / Self Care | Payer: 59 | Source: Ambulatory Visit | Attending: Obstetrics and Gynecology | Admitting: Obstetrics and Gynecology

## 2015-09-13 DIAGNOSIS — R928 Other abnormal and inconclusive findings on diagnostic imaging of breast: Secondary | ICD-10-CM

## 2015-09-18 MED FILL — RESTASIS 0.05% EYE EMULSION: 0.05 | 30 days supply | Qty: 60 | Fill #4

## 2015-10-02 DIAGNOSIS — K130 Diseases of lips: Secondary | ICD-10-CM | POA: Diagnosis not present

## 2015-10-02 DIAGNOSIS — L239 Allergic contact dermatitis, unspecified cause: Secondary | ICD-10-CM | POA: Diagnosis not present

## 2015-10-02 MED FILL — FLUTICASONE PROP 0.05% CRM: 0.05 | 10 days supply | Qty: 30 | Fill #0

## 2015-10-26 MED FILL — RESTASIS 0.05% EYE EMULSION: 0.05 | 30 days supply | Qty: 60 | Fill #5

## 2015-10-26 MED FILL — SYNTHROID 100 MCG TABLET: 100 | 90 days supply | Qty: 90 | Fill #1

## 2015-11-12 MED FILL — ATORVASTATIN 10 MG TABLET: 10 | 90 days supply | Qty: 90 | Fill #0

## 2015-11-28 DIAGNOSIS — Z1389 Encounter for screening for other disorder: Secondary | ICD-10-CM | POA: Diagnosis not present

## 2015-11-28 DIAGNOSIS — D1809 Hemangioma of other sites: Secondary | ICD-10-CM | POA: Diagnosis not present

## 2015-11-28 DIAGNOSIS — Z6823 Body mass index (BMI) 23.0-23.9, adult: Secondary | ICD-10-CM | POA: Diagnosis not present

## 2015-11-28 DIAGNOSIS — E038 Other specified hypothyroidism: Secondary | ICD-10-CM | POA: Diagnosis not present

## 2015-11-28 DIAGNOSIS — D6489 Other specified anemias: Secondary | ICD-10-CM | POA: Diagnosis not present

## 2015-12-05 MED FILL — RESTASIS 0.05% EYE EMULSION: 0.05 | 30 days supply | Qty: 60 | Fill #6

## 2016-01-11 MED FILL — RESTASIS 0.05% EYE EMULSION: 0.05 | 30 days supply | Qty: 60 | Fill #7

## 2016-01-25 MED FILL — SYNTHROID 100 MCG TABLET: 100 | 90 days supply | Qty: 90 | Fill #2

## 2016-02-13 MED FILL — ATORVASTATIN 10 MG TABLET: 10 | 90 days supply | Qty: 90 | Fill #1

## 2016-03-10 ENCOUNTER — Encounter: Payer: Self-pay | Admitting: Gastroenterology

## 2016-03-27 DIAGNOSIS — Z01419 Encounter for gynecological examination (general) (routine) without abnormal findings: Secondary | ICD-10-CM | POA: Diagnosis not present

## 2016-03-27 DIAGNOSIS — Z6824 Body mass index (BMI) 24.0-24.9, adult: Secondary | ICD-10-CM | POA: Diagnosis not present

## 2016-03-27 MED FILL — CLOTRIMAZOLE-BETAMETHASONE: 1-0.05 | 7 days supply | Qty: 15 | Fill #0

## 2016-04-03 ENCOUNTER — Ambulatory Visit (AMBULATORY_SURGERY_CENTER): Payer: Self-pay | Admitting: *Deleted

## 2016-04-03 VITALS — Ht 66.5 in | Wt 149.8 lb

## 2016-04-03 DIAGNOSIS — Z1211 Encounter for screening for malignant neoplasm of colon: Secondary | ICD-10-CM

## 2016-04-03 MED ORDER — NA SULFATE-K SULFATE-MG SULF 17.5-3.13-1.6 GM/177ML PO SOLN: 1.0000 | Freq: Once | ORAL | 0 refills | Status: AC

## 2016-04-03 MED FILL — SUPREP BOWEL PREP KIT: 17.5-3.13-1 | 1 days supply | Qty: 354 | Fill #0

## 2016-04-03 NOTE — Progress Notes (Signed)
Denies allergies to eggs or soy products. Denies complications with sedation or anesthesia. Denies O2 use. Denies use of diet or weight loss medications.  Emmi instructions given for colonoscopy.  

## 2016-04-16 MED FILL — RESTASIS 0.05% EYE EMULSION: 0.05 | 90 days supply | Qty: 180 | Fill #0

## 2016-04-17 ENCOUNTER — Ambulatory Visit (AMBULATORY_SURGERY_CENTER): Payer: 59 | Admitting: Gastroenterology

## 2016-04-17 ENCOUNTER — Encounter: Payer: Self-pay | Admitting: Gastroenterology

## 2016-04-17 VITALS — BP 121/65 | HR 49 | Temp 98.7°F | Resp 11 | Ht 66.0 in | Wt 144.0 lb

## 2016-04-17 DIAGNOSIS — D123 Benign neoplasm of transverse colon: Secondary | ICD-10-CM

## 2016-04-17 DIAGNOSIS — Z1211 Encounter for screening for malignant neoplasm of colon: Secondary | ICD-10-CM | POA: Diagnosis not present

## 2016-04-17 DIAGNOSIS — E039 Hypothyroidism, unspecified: Secondary | ICD-10-CM | POA: Diagnosis not present

## 2016-04-17 MED ORDER — SODIUM CHLORIDE 0.9 % IV SOLN: 500.0000 mL | INTRAVENOUS | Status: DC

## 2016-04-17 NOTE — Progress Notes (Signed)
Called to room to assist during endoscopic procedure.  Patient ID and intended procedure confirmed with present staff. Received instructions for my participation in the procedure from the performing physician.  

## 2016-04-17 NOTE — Progress Notes (Signed)
To recovery, report to [place], RN, VSS 

## 2016-04-17 NOTE — Op Note (Signed)
[facility] Patient Name: Jessica Herrera Procedure Date: 04/17/2016 3:09 PM MRN: [id number] Endoscopist: Jessica Herrera , MD Age: 60 Referring MD:  Date of Birth: 30-Mar-1956 Gender: Female Account #: [id number] Procedure:                Colonoscopy Indications:              Screening for malignant neoplasm in the colon Medicines:                Monitored Anesthesia Care Procedure:                Pre-Anesthesia Assessment:                           - Prior to the procedure, a History and Physical                            was performed, and patient medications and                            allergies were reviewed. The patient's tolerance of                            previous anesthesia was also reviewed. The risks                            and benefits of the procedure and the sedation                            options and risks were discussed with the patient.                            All questions were answered, and informed consent                            was obtained. Prior Anticoagulants: The patient has                            taken no previous anticoagulant or antiplatelet                            agents. ASA Grade Assessment: II - A patient with                            mild systemic disease. After reviewing the risks                            and benefits, the patient was deemed in                            satisfactory condition to undergo the procedure.                           After obtaining informed consent, the colonoscope  was passed under direct vision. Throughout the                            procedure, the patient's blood pressure, pulse, and                            oxygen saturations were monitored continuously. The                            Model PCF-H190DL [id number]) scope was introduced                            through the anus and advanced to the the terminal                            ileum,  with identification of the appendiceal                            orifice and IC valve. The colonoscopy was performed                            without difficulty. The patient tolerated the                            procedure well. The quality of the bowel                            preparation was good. The terminal ileum, ileocecal                            valve, appendiceal orifice, and rectum were                            photographed. Scope In: 3:25:33 PM Scope Out: 3:56:33 PM Scope Withdrawal Time: 0 hours 26 minutes 38 seconds  Total Procedure Duration: 0 hours 31 minutes 0 seconds  Findings:                 The perianal and digital rectal examinations were                            normal.                           Many medium-mouthed diverticula were found in the                            left colon.                           A 10 mm polyp was found in the splenic flexure. The                            polyp was sessile. The polyp was removed with a  cold snare. Resection and retrieval were complete.                           A 20 mm polyp was found in the splenic flexure                            behind a very angulated fold, and was technically                            challenging to obtain good positioning to remove                            it. The polyp was sessile, it was difficult to see                            the entire size of it until it was removed. The                            polyp was removed with a piecemeal technique using                            a cold snare. Resection and retrieval were                            complete. Area was tattooed with an injection of                            dye.                           The terminal ileum appeared normal.                           The exam was otherwise without abnormality on                            direct and retroflexion views. Complications:            No  immediate complications. Estimated blood loss:                            Minimal. Estimated Blood Loss:     Estimated blood loss was minimal. Impression:               - Diverticulosis in the left colon.                           - One 10 mm polyp at the splenic flexure, removed                            with a cold snare. Resected and retrieved.                           - One 20 mm polyp at the splenic flexure, removed  piecemeal using a cold snare. Resected and                            retrieved. Tattooed.                           - The examined portion of the ileum was normal.                           - The examination was otherwise normal on direct                            and retroflexion views. Recommendation:           - Patient has a contact number available for                            emergencies. The signs and symptoms of potential                            delayed complications were discussed with the                            patient. Return to normal activities tomorrow.                            Written discharge instructions were provided to the                            patient.                           - Resume previous diet.                           - Continue present medications.                           - No aspirin, ibuprofen, naproxen, or other                            non-steroidal anti-inflammatory drugs for 2 weeks                            after polyp removal.                           - Await pathology results.                           - Repeat colonoscopy is recommended for                            surveillance in 6 months Jessica Herrera P. Jessica Wexler, MD 04/17/2016 4:03:56 PM This report has been signed electronically.

## 2016-04-17 NOTE — Patient Instructions (Signed)
YOU HAD AN ENDOSCOPIC PROCEDURE TODAY AT [facility]:   Refer to the procedure report that was given to you for any specific questions about what was found during the examination.  If the procedure report does not answer your questions, please call your gastroenterologist to clarify.  If you requested that your care partner not be given the details of your procedure findings, then the procedure report has been included in a sealed envelope for you to review at your convenience later.  YOU SHOULD EXPECT: Some feelings of bloating in the abdomen. Passage of more gas than usual.  Walking can help get rid of the air that was put into your GI tract during the procedure and reduce the bloating. If you had a lower endoscopy (such as a colonoscopy or flexible sigmoidoscopy) you may notice spotting of blood in your stool or on the toilet paper. If you underwent a bowel prep for your procedure, you may not have a normal bowel movement for a few days.  Please Note:  You might notice some irritation and congestion in your nose or some drainage.  This is from the oxygen used during your procedure.  There is no need for concern and it should clear up in a day or so.  SYMPTOMS TO REPORT IMMEDIATELY:   Following lower endoscopy (colonoscopy or flexible sigmoidoscopy):  Excessive amounts of blood in the stool  Significant tenderness or worsening of abdominal pains  Swelling of the abdomen that is new, acute  Fever of 100F or higher  For urgent or emergent issues, a gastroenterologist can be reached at any hour by calling [phone].   DIET:  We do recommend a small meal at first, but then you may proceed to your regular diet.  Drink plenty of fluids but you should avoid alcoholic beverages for 24 hours.  ACTIVITY:  You should plan to take it easy for the rest of today and you should NOT DRIVE or use heavy machinery until tomorrow (because of the sedation medicines used during the test).     FOLLOW UP: Our staff will call the number listed on your records the next business day following your procedure to check on you and address any questions or concerns that you may have regarding the information given to you following your procedure. If we do not reach you, we will leave a message.  However, if you are feeling well and you are not experiencing any problems, there is no need to return our call.  We will assume that you have returned to your regular daily activities without incident.  If any biopsies were taken you will be contacted by phone or by letter within the next 1-3 weeks.  Please call us at [phone] if you have not heard about the biopsies in 3 weeks.    SIGNATURES/CONFIDENTIALITY: You and/or your care partner have signed paperwork which will be entered into your electronic medical record.  These signatures attest to the fact that that the information above on your After Visit Summary has been reviewed and is understood.  Full responsibility of the confidentiality of this discharge information lies with you and/or your care-partner.  NO ASPIRIN, ASPIRIN CONTAINING PRODUCTS (BC OR GOODY POWDERS), OR NSAIDS (IBUPROFEN, ADVIL, ALEVE, MOTRIN, OR NAPROXEN) FOR 2 WEEKS, TYLENOL IS OK TO TAKE  Next colonoscopy will be in 6 months.  We will send you a letter to set that up  Please read over handouts about polyps, diverticulosis and high fiber diets

## 2016-04-18 ENCOUNTER — Telehealth: Payer: Self-pay | Admitting: *Deleted

## 2016-04-18 NOTE — Telephone Encounter (Signed)
  Follow up Call-  Call back number 04/17/2016  Post procedure Call Back phone  # [phone]  Permission to leave phone message Yes  Some recent data might be hidden     Patient questions:  Do you have a fever, pain , or abdominal swelling? No. Pain Score  0 *  Have you tolerated food without any problems? Yes.    Have you been able to return to your normal activities? Yes.    Do you have any questions about your discharge instructions: Diet   No. Medications  No. Follow up visit  No.  Do you have questions or concerns about your Care? No.  Actions: * If pain score is 4 or above: No action needed, pain <4.

## 2016-04-23 ENCOUNTER — Encounter: Payer: Self-pay | Admitting: Gastroenterology

## 2016-04-25 MED FILL — SYNTHROID 100 MCG TABLET: 100 | 90 days supply | Qty: 90 | Fill #0

## 2016-05-19 MED FILL — ATORVASTATIN 10 MG TABLET: 10 | 90 days supply | Qty: 90 | Fill #2

## 2016-06-03 ENCOUNTER — Encounter: Payer: Self-pay | Admitting: Gastroenterology

## 2016-06-04 DIAGNOSIS — D1800 Hemangioma unspecified site: Secondary | ICD-10-CM | POA: Diagnosis not present

## 2016-06-04 DIAGNOSIS — Z6824 Body mass index (BMI) 24.0-24.9, adult: Secondary | ICD-10-CM | POA: Diagnosis not present

## 2016-06-04 DIAGNOSIS — Z23 Encounter for immunization: Secondary | ICD-10-CM | POA: Diagnosis not present

## 2016-06-04 DIAGNOSIS — D6489 Other specified anemias: Secondary | ICD-10-CM | POA: Diagnosis not present

## 2016-06-04 DIAGNOSIS — E038 Other specified hypothyroidism: Secondary | ICD-10-CM | POA: Diagnosis not present

## 2016-06-04 DIAGNOSIS — Z0000 Encounter for general adult medical examination without abnormal findings: Secondary | ICD-10-CM | POA: Diagnosis not present

## 2016-06-18 DIAGNOSIS — M71341 Other bursal cyst, right hand: Secondary | ICD-10-CM | POA: Diagnosis not present

## 2016-06-18 DIAGNOSIS — D1801 Hemangioma of skin and subcutaneous tissue: Secondary | ICD-10-CM | POA: Diagnosis not present

## 2016-06-18 DIAGNOSIS — L738 Other specified follicular disorders: Secondary | ICD-10-CM | POA: Diagnosis not present

## 2016-06-18 DIAGNOSIS — M71342 Other bursal cyst, left hand: Secondary | ICD-10-CM | POA: Diagnosis not present

## 2016-06-18 DIAGNOSIS — L814 Other melanin hyperpigmentation: Secondary | ICD-10-CM | POA: Diagnosis not present

## 2016-06-18 DIAGNOSIS — D225 Melanocytic nevi of trunk: Secondary | ICD-10-CM | POA: Diagnosis not present

## 2016-06-18 DIAGNOSIS — L821 Other seborrheic keratosis: Secondary | ICD-10-CM | POA: Diagnosis not present

## 2016-07-17 MED FILL — SYNTHROID 100 MCG TABLET: 100 | 90 days supply | Qty: 90 | Fill #1

## 2016-07-31 DIAGNOSIS — H0520 Unspecified exophthalmos: Secondary | ICD-10-CM | POA: Diagnosis not present

## 2016-07-31 DIAGNOSIS — J330 Polyp of nasal cavity: Secondary | ICD-10-CM | POA: Diagnosis not present

## 2016-07-31 DIAGNOSIS — Z8639 Personal history of other endocrine, nutritional and metabolic disease: Secondary | ICD-10-CM | POA: Diagnosis not present

## 2016-07-31 DIAGNOSIS — J342 Deviated nasal septum: Secondary | ICD-10-CM | POA: Diagnosis not present

## 2016-07-31 DIAGNOSIS — J3489 Other specified disorders of nose and nasal sinuses: Secondary | ICD-10-CM | POA: Diagnosis not present

## 2016-07-31 MED FILL — predniSONE 20 MG TABS: 20 | 25 days supply | Qty: 36 | Fill #0

## 2016-07-31 MED FILL — AMOX-CLAV 500-125 MG TABLET: 500-125 | 30 days supply | Qty: 60 | Fill #0

## 2016-07-31 MED FILL — FLUTICASONE PROP 50 MCG SPR: 50 | 30 days supply | Qty: 16 | Fill #0

## 2016-08-07 ENCOUNTER — Other Ambulatory Visit: Payer: Self-pay | Admitting: Obstetrics and Gynecology

## 2016-08-07 DIAGNOSIS — Z1231 Encounter for screening mammogram for malignant neoplasm of breast: Secondary | ICD-10-CM

## 2016-08-19 MED FILL — ATORVASTATIN 10 MG TABLET: 10 | 90 days supply | Qty: 90 | Fill #3

## 2016-08-20 ENCOUNTER — Ambulatory Visit: Payer: 59 | Admitting: *Deleted

## 2016-08-20 VITALS — Ht 66.5 in | Wt 153.2 lb

## 2016-08-20 DIAGNOSIS — Z86010 Personal history of colonic polyps: Secondary | ICD-10-CM

## 2016-08-20 MED ORDER — NA SULFATE-K SULFATE-MG SULF 17.5-3.13-1.6 GM/177ML PO SOLN: 1.0000 | Freq: Once | ORAL | 0 refills | Status: AC

## 2016-08-20 MED FILL — SUPREP BOWEL PREP KIT: 17.5-3.13-1 | 1 days supply | Qty: 354 | Fill #0

## 2016-08-20 NOTE — Progress Notes (Signed)
Denies allergies to eggs or soy products. Denies complications with sedation or anesthesia. Denies O2 use. Denies use of diet or weight loss medications.  Emmi instructions declined for colonoscopy.  

## 2016-08-27 MED FILL — FLUCONAZOLE 150 MG TABLET: 150 | 1 days supply | Qty: 1 | Fill #0

## 2016-08-28 ENCOUNTER — Encounter: Payer: Self-pay | Admitting: Gastroenterology

## 2016-08-28 ENCOUNTER — Ambulatory Visit (AMBULATORY_SURGERY_CENTER): Payer: 59 | Admitting: Gastroenterology

## 2016-08-28 ENCOUNTER — Other Ambulatory Visit: Payer: Self-pay

## 2016-08-28 ENCOUNTER — Other Ambulatory Visit (INDEPENDENT_AMBULATORY_CARE_PROVIDER_SITE_OTHER): Payer: 59

## 2016-08-28 VITALS — BP 106/68 | HR 44 | Temp 97.5°F | Resp 10 | Ht 66.0 in | Wt 153.0 lb

## 2016-08-28 DIAGNOSIS — D127 Benign neoplasm of rectosigmoid junction: Secondary | ICD-10-CM | POA: Diagnosis not present

## 2016-08-28 DIAGNOSIS — Z86010 Personal history of colonic polyps: Secondary | ICD-10-CM

## 2016-08-28 DIAGNOSIS — E039 Hypothyroidism, unspecified: Secondary | ICD-10-CM | POA: Diagnosis not present

## 2016-08-28 DIAGNOSIS — F79 Unspecified intellectual disabilities: Secondary | ICD-10-CM | POA: Diagnosis not present

## 2016-08-28 DIAGNOSIS — D1803 Hemangioma of intra-abdominal structures: Secondary | ICD-10-CM | POA: Diagnosis not present

## 2016-08-28 DIAGNOSIS — K635 Polyp of colon: Secondary | ICD-10-CM | POA: Diagnosis not present

## 2016-08-28 DIAGNOSIS — D123 Benign neoplasm of transverse colon: Secondary | ICD-10-CM

## 2016-08-28 LAB — HEPATIC FUNCTION PANEL
ALBUMIN: 4 g/dL (ref 3.5–5.2)
ALT: 42 U/L — ABNORMAL HIGH (ref 0–35)
AST: 23 U/L (ref 0–37)
Alkaline Phosphatase: 54 U/L (ref 39–117)
Bilirubin, Direct: 0.1 mg/dL (ref 0.0–0.3)
Total Bilirubin: 0.5 mg/dL (ref 0.2–1.2)
Total Protein: 6.2 g/dL (ref 6.0–8.3)

## 2016-08-28 MED ORDER — SODIUM CHLORIDE 0.9 % IV SOLN: 500.0000 mL | INTRAVENOUS | Status: AC

## 2016-08-28 NOTE — Progress Notes (Signed)
Pt's states no medical or surgical changes since previsit or office visit. 

## 2016-08-28 NOTE — Progress Notes (Signed)
Report to PACU, RN, vss, BBS= Clear.  

## 2016-08-28 NOTE — Progress Notes (Signed)
Called to room to assist during endoscopic procedure.  Patient ID and intended procedure confirmed with present staff. Received instructions for my participation in the procedure from the performing physician.  

## 2016-08-28 NOTE — Op Note (Signed)
[facility] Patient Name: Jessica Herrera Procedure Date: 08/28/2016 3:10 PM MRN: [id number] Endoscopist: Remo Lipps P. Armbruster MD, MD Age: 61 Referring MD:  Date of Birth: 1956-03-29 Gender: Female Account #: [id number] Procedure:                Colonoscopy Indications:              High risk colon cancer surveillance: Personal                            history of colonic polyps - piecemeal resection of                            large flat splenic flexure sessile serrated adenoma                            in Sept 2017 Medicines:                Monitored Anesthesia Care Procedure:                Pre-Anesthesia Assessment:                           - Prior to the procedure, a History and Physical                            was performed, and patient medications and                            allergies were reviewed. The patient's tolerance of                            previous anesthesia was also reviewed. The risks                            and benefits of the procedure and the sedation                            options and risks were discussed with the patient.                            All questions were answered, and informed consent                            was obtained. Prior Anticoagulants: The patient has                            taken no previous anticoagulant or antiplatelet                            agents. ASA Grade Assessment: II - A patient with                            mild systemic disease. After reviewing the risks  and benefits, the patient was deemed in                            satisfactory condition to undergo the procedure.                           After obtaining informed consent, the colonoscope                            was passed under direct vision. Throughout the                            procedure, the patient's blood pressure, pulse, and                            oxygen saturations were monitored  continuously. The                            Model PCF-H190L [id number]) scope was introduced                            through the anus and advanced to the the cecum,                            identified by appendiceal orifice and ileocecal                            valve. The colonoscopy was performed without                            difficulty. The patient tolerated the procedure                            well. The quality of the bowel preparation was                            good. The ileocecal valve, appendiceal orifice, and                            rectum were photographed. Scope In: 3:31:54 PM Scope Out: 3:56:37 PM Scope Withdrawal Time: 0 hours 21 minutes 3 seconds  Total Procedure Duration: 0 hours 24 minutes 43 seconds  Findings:                 The perianal and digital rectal examinations were                            normal.                           Multiple medium-mouthed diverticula were found in                            the sigmoid colon.  A post polypectomy scar was found at the splenic                            flexure. The scar was difficult to completely                            visualize due to its location behind a fold.                            Multiple passes made with the endoscope until good                            visualization was achieved. Overall the site looked                            good with a ? 65mm sessile area of either residual                            polyp versus scar tissue in the middle of it. It                            was resected with snare.                           A 4 mm polyp was found in the recto-sigmoid colon.                            The polyp was sessile and I suspect hyperplastic.                            The polyp was removed with a cold snare. Resection                            and retrieval were complete.                           The exam was otherwise without  abnormality. Complications:            No immediate complications. Estimated blood loss:                            Minimal. Estimated Blood Loss:     Estimated blood loss was minimal. Impression:               - Diverticulosis in the sigmoid colon.                           - Post-polypectomy scar at the splenic flexure.                           - One 4 mm polyp at the recto-sigmoid colon,                            removed with a  cold snare. Resected and retrieved.                           - The examination was otherwise normal. Recommendation:           - Patient has a contact number available for                            emergencies. The signs and symptoms of potential                            delayed complications were discussed with the                            patient. Return to normal activities tomorrow.                            Written discharge instructions were provided to the                            patient.                           - Resume previous diet.                           - Continue present medications.                           - Await pathology results.                           - Repeat colonoscopy in 3 years for surveillance. Remo Lipps P. Armbruster MD, MD 08/28/2016 4:02:29 PM This report has been signed electronically.

## 2016-08-28 NOTE — Patient Instructions (Addendum)
YOU HAD AN ENDOSCOPIC PROCEDURE TODAY AT [facility]:   Refer to the procedure report that was given to you for any specific questions about what was found during the examination.  If the procedure report does not answer your questions, please call your gastroenterologist to clarify.  If you requested that your care partner not be given the details of your procedure findings, then the procedure report has been included in a sealed envelope for you to review at your convenience later.  YOU SHOULD EXPECT: Some feelings of bloating in the abdomen. Passage of more gas than usual.  Walking can help get rid of the air that was put into your GI tract during the procedure and reduce the bloating. If you had a lower endoscopy (such as a colonoscopy or flexible sigmoidoscopy) you may notice spotting of blood in your stool or on the toilet paper. If you underwent a bowel prep for your procedure, you may not have a normal bowel movement for a few days.  Please Note:  You might notice some irritation and congestion in your nose or some drainage.  This is from the oxygen used during your procedure.  There is no need for concern and it should clear up in a day or so.  SYMPTOMS TO REPORT IMMEDIATELY:   Following lower endoscopy (colonoscopy or flexible sigmoidoscopy):  Excessive amounts of blood in the stool  Significant tenderness or worsening of abdominal pains  Swelling of the abdomen that is new, acute  Fever of 100F or higher    For urgent or emergent issues, a gastroenterologist can be reached at any hour by calling [phone].   DIET:  We do recommend a small meal at first, but then you may proceed to your regular diet.  Drink plenty of fluids but you should avoid alcoholic beverages for 24 hours.  ACTIVITY:  You should plan to take it easy for the rest of today and you should NOT DRIVE or use heavy machinery until tomorrow (because of the sedation medicines used during the test).     FOLLOW UP: Our staff will call the number listed on your records the next business day following your procedure to check on you and address any questions or concerns that you may have regarding the information given to you following your procedure. If we do not reach you, we will leave a message.  However, if you are feeling well and you are not experiencing any problems, there is no need to return our call.  We will assume that you have returned to your regular daily activities without incident.  If any biopsies were taken you will be contacted by phone or by letter within the next 1-3 weeks.  Please call us at [phone] if you have not heard about the biopsies in 3 weeks.    SIGNATURES/CONFIDENTIALITY: You and/or your care partner have signed paperwork which will be entered into your electronic medical record.  These signatures attest to the fact that that the information above on your After Visit Summary has been reviewed and is understood.  Full responsibility of the confidentiality of this discharge information lies with you and/or your care-partner.   Information on polyps and diverticulosis given to you today.  Liver function test (labwork) ordered by Pricilla Riffle and pt will go by lab for this on d/c today

## 2016-08-29 ENCOUNTER — Telehealth: Payer: Self-pay

## 2016-08-29 ENCOUNTER — Other Ambulatory Visit: Payer: Self-pay

## 2016-08-29 DIAGNOSIS — D1803 Hemangioma of intra-abdominal structures: Secondary | ICD-10-CM

## 2016-08-29 NOTE — Telephone Encounter (Signed)
  Follow up Call-  Call back number 08/28/2016 04/17/2016  Post procedure Call Back phone  # [phone]  Permission to leave phone message Yes Yes  Some recent data might be hidden     Patient questions:  Do you have a fever, pain , or abdominal swelling? No. Pain Score  0 *  Have you tolerated food without any problems? Yes.    Have you been able to return to your normal activities? Yes.    Do you have any questions about your discharge instructions: Diet   No. Medications  No. Follow up visit  No.  Do you have questions or concerns about your Care? No.  Actions: * If pain score is 4 or above: No action needed, pain <4.

## 2016-09-10 ENCOUNTER — Other Ambulatory Visit (HOSPITAL_COMMUNITY): Payer: Self-pay | Admitting: Otolaryngology

## 2016-09-10 ENCOUNTER — Encounter: Payer: Self-pay | Admitting: Gastroenterology

## 2016-09-10 DIAGNOSIS — J339 Nasal polyp, unspecified: Secondary | ICD-10-CM

## 2016-09-10 DIAGNOSIS — J329 Chronic sinusitis, unspecified: Secondary | ICD-10-CM

## 2016-09-16 ENCOUNTER — Ambulatory Visit (HOSPITAL_COMMUNITY)
Admission: RE | Admit: 2016-09-16 | Discharge: 2016-09-16 | Disposition: A | Discharge status: Home / Self Care | Payer: 59 | Source: Ambulatory Visit | Attending: Gastroenterology | Admitting: Gastroenterology

## 2016-09-16 ENCOUNTER — Ambulatory Visit (HOSPITAL_COMMUNITY)
Admission: RE | Admit: 2016-09-16 | Discharge: 2016-09-16 | Disposition: A | Discharge status: Home / Self Care | Payer: 59 | Source: Ambulatory Visit | Attending: Otolaryngology | Admitting: Otolaryngology

## 2016-09-16 DIAGNOSIS — M2669 Other specified disorders of temporomandibular joint: Secondary | ICD-10-CM | POA: Diagnosis not present

## 2016-09-16 DIAGNOSIS — J329 Chronic sinusitis, unspecified: Secondary | ICD-10-CM | POA: Insufficient documentation

## 2016-09-16 DIAGNOSIS — D1803 Hemangioma of intra-abdominal structures: Secondary | ICD-10-CM | POA: Diagnosis not present

## 2016-09-16 DIAGNOSIS — J3489 Other specified disorders of nose and nasal sinuses: Secondary | ICD-10-CM | POA: Insufficient documentation

## 2016-09-16 DIAGNOSIS — R600 Localized edema: Secondary | ICD-10-CM | POA: Diagnosis not present

## 2016-09-16 DIAGNOSIS — J330 Polyp of nasal cavity: Secondary | ICD-10-CM | POA: Diagnosis not present

## 2016-09-16 DIAGNOSIS — J339 Nasal polyp, unspecified: Secondary | ICD-10-CM | POA: Diagnosis not present

## 2016-09-17 ENCOUNTER — Ambulatory Visit (HOSPITAL_COMMUNITY): Payer: 59

## 2016-09-17 ENCOUNTER — Telehealth: Payer: Self-pay

## 2016-09-17 MED FILL — FLUTICASONE PROP 50 MCG SPR: 50 | 30 days supply | Qty: 16 | Fill #1

## 2016-09-17 NOTE — Telephone Encounter (Signed)
-----   Message from Manus Gunning, MD sent at 09/17/2016  8:16 AM EST ----- Caryl Pina can you please let this patient know that her ultrasound shows a stable, large hemangioma of the liver, when compared to prior imaging studies over time, there does not appear to be any interval growth, which is reassuring. Please let her know this and if she has any symptoms that are bothering her she can follow-up with me in clinic for reassessment. Thanks

## 2016-09-17 NOTE — Telephone Encounter (Signed)
Pt informed of results. She will follow up as needed.

## 2016-09-22 MED FILL — CLINDAMYCIN HCL 150 MG CAPS: 150 | 30 days supply | Qty: 90 | Fill #0

## 2016-10-01 ENCOUNTER — Ambulatory Visit
Admission: RE | Admit: 2016-10-01 | Discharge: 2016-10-01 | Disposition: A | Discharge status: Home / Self Care | Payer: 59 | Source: Ambulatory Visit | Attending: Obstetrics and Gynecology | Admitting: Obstetrics and Gynecology

## 2016-10-01 DIAGNOSIS — Z1231 Encounter for screening mammogram for malignant neoplasm of breast: Secondary | ICD-10-CM

## 2016-10-01 MED FILL — RESTASIS 0.05% EYE EMULSION: 0.05 | 90 days supply | Qty: 180 | Fill #1

## 2016-10-03 ENCOUNTER — Other Ambulatory Visit: Payer: Self-pay | Admitting: Obstetrics and Gynecology

## 2016-10-03 DIAGNOSIS — R928 Other abnormal and inconclusive findings on diagnostic imaging of breast: Secondary | ICD-10-CM

## 2016-10-07 ENCOUNTER — Ambulatory Visit
Admission: RE | Admit: 2016-10-07 | Discharge: 2016-10-07 | Disposition: A | Discharge status: Home / Self Care | Payer: 59 | Source: Ambulatory Visit | Attending: Obstetrics and Gynecology | Admitting: Obstetrics and Gynecology

## 2016-10-07 DIAGNOSIS — N6489 Other specified disorders of breast: Secondary | ICD-10-CM | POA: Diagnosis not present

## 2016-10-07 DIAGNOSIS — R928 Other abnormal and inconclusive findings on diagnostic imaging of breast: Secondary | ICD-10-CM

## 2016-10-20 MED FILL — BESIVANCE 0.6% SUSP: 0.6 | 30 days supply | Qty: 5 | Fill #0

## 2016-10-20 MED FILL — DUREZOL 0.05% EYE DROPS: 0.05 | 30 days supply | Qty: 5 | Fill #0

## 2016-10-21 MED FILL — SYNTHROID 100 MCG TABLET: 100 | 90 days supply | Qty: 90 | Fill #2

## 2016-10-22 MED FILL — PROLENSA 0.07% EYE DROPS: 0.07 | 60 days supply | Qty: 3 | Fill #0

## 2016-10-31 MED FILL — FLUTICASONE PROP 50 MCG SPR: 50 | 30 days supply | Qty: 16 | Fill #2

## 2016-11-17 MED FILL — ATORVASTATIN 10 MG TABLET: 10 | 90 days supply | Qty: 90 | Fill #0

## 2016-11-18 DIAGNOSIS — H2512 Age-related nuclear cataract, left eye: Secondary | ICD-10-CM | POA: Diagnosis not present

## 2016-12-03 DIAGNOSIS — E038 Other specified hypothyroidism: Secondary | ICD-10-CM | POA: Diagnosis not present

## 2016-12-03 DIAGNOSIS — D1800 Hemangioma unspecified site: Secondary | ICD-10-CM | POA: Diagnosis not present

## 2016-12-03 DIAGNOSIS — Z6824 Body mass index (BMI) 24.0-24.9, adult: Secondary | ICD-10-CM | POA: Diagnosis not present

## 2016-12-03 DIAGNOSIS — Z1389 Encounter for screening for other disorder: Secondary | ICD-10-CM | POA: Diagnosis not present

## 2016-12-03 DIAGNOSIS — D649 Anemia, unspecified: Secondary | ICD-10-CM | POA: Diagnosis not present

## 2016-12-03 DIAGNOSIS — K635 Polyp of colon: Secondary | ICD-10-CM | POA: Diagnosis not present

## 2017-01-20 MED FILL — SYNTHROID 100 MCG TABLET: 100 | 90 days supply | Qty: 90 | Fill #0

## 2017-02-04 ENCOUNTER — Encounter (INDEPENDENT_AMBULATORY_CARE_PROVIDER_SITE_OTHER): Payer: 59 | Admitting: Ophthalmology

## 2017-02-12 ENCOUNTER — Encounter (INDEPENDENT_AMBULATORY_CARE_PROVIDER_SITE_OTHER): Payer: 59 | Admitting: Ophthalmology

## 2017-02-16 MED FILL — ATORVASTATIN 10 MG TABLET: 10 | 30 days supply | Qty: 30 | Fill #0

## 2017-02-24 ENCOUNTER — Telehealth (INDEPENDENT_AMBULATORY_CARE_PROVIDER_SITE_OTHER): Payer: Self-pay | Admitting: Radiology

## 2017-02-24 NOTE — Telephone Encounter (Signed)
Patient needs appointment Monday 815.

## 2017-03-02 ENCOUNTER — Ambulatory Visit (INDEPENDENT_AMBULATORY_CARE_PROVIDER_SITE_OTHER): Payer: 59

## 2017-03-02 ENCOUNTER — Encounter (INDEPENDENT_AMBULATORY_CARE_PROVIDER_SITE_OTHER): Payer: Self-pay | Admitting: Orthopedic Surgery

## 2017-03-02 ENCOUNTER — Ambulatory Visit (INDEPENDENT_AMBULATORY_CARE_PROVIDER_SITE_OTHER): Payer: 59 | Admitting: Orthopedic Surgery

## 2017-03-02 VITALS — Ht 66.0 in | Wt 153.0 lb

## 2017-03-02 DIAGNOSIS — M84374A Stress fracture, right foot, initial encounter for fracture: Secondary | ICD-10-CM | POA: Diagnosis not present

## 2017-03-02 DIAGNOSIS — M79671 Pain in right foot: Secondary | ICD-10-CM

## 2017-03-02 DIAGNOSIS — M79672 Pain in left foot: Secondary | ICD-10-CM

## 2017-03-02 NOTE — Progress Notes (Signed)
Office Visit Note   Patient: Jessica Herrera           Date of Birth: [birth year]           MRN: [id number] Visit Date: 03/02/2017              Requested by: Reynold Bowen, [place], [place] [address] PCP: Reynold Bowen, MD  Chief Complaint  Patient presents with  . Right Foot - Pain    Twist injury this weekend       HPI: Patient is a 61 year old woman who was in [place] sustained an acute pain in the second metatarsal right foot. She states that while she was at home this morning she felt an acute pop in her foot. She states prior to this episode the foot was feeling better.  Assessment & Plan: Visit Diagnoses:  1. Pain in left foot   2. Pain in right foot   3. Metatarsal stress fracture, right, initial encounter     Plan: Patient will wear a postoperative shoe for the next 4 weeks. Recommended 5000 international units of vitamin D 3 a day.  Repeat 3 view radiographs of the right foot at follow-up.  Follow-Up Instructions: Return in about 4 weeks (around 03/30/2017).   Ortho Exam  Patient is alert, oriented, no adenopathy, well-dressed, normal affect, normal respiratory effort. Examination patient has a good pulse she has swelling there is no ecchymosis or bruising. She is tender to palpation across the forefoot. Lisfranc complex is nontender.  Imaging: Xr Foot Complete Right  Result Date: 03/02/2017 Three-view radiographs of the right foot shows a fracture through the neck of the second metatarsal. There is no widening of the Lisfranc complex.   Labs: No results found for: HGBA1C, ESRSEDRATE, CRP, LABURIC, REPTSTATUS, GRAMSTAIN, CULT, LABORGA  Orders:  Orders Placed This Encounter  Procedures  . XR Foot Complete Right   No orders of the defined types were placed in this encounter.    Procedures: No procedures performed  Clinical Data: No additional findings.  ROS:  All other systems negative, except as noted in the HPI. Review of  Systems  Objective: Vital Signs: Ht 5\' 6"  (1.676 m)   Wt 153 lb (69.4 kg)   BMI 24.69 kg/m   Specialty Comments:  No specialty comments available.  PMFS History: Patient Active Problem List   Diagnosis Date Noted  . Metatarsal stress fracture, right, initial encounter 03/02/2017  . Heart palpitations, with dizziness 04/07/2013  . B12 DEFICIENCY 02/08/2010  . LIVER HEMANGIOMA 01/31/2010  . LIVER MASS 01/31/2010  . HYPOTHYROIDISM 01/31/2010  . DIVERTICULOSIS OF COLON 01/31/2010   Past Medical History:  Diagnosis Date  . Arthritis    wrists  . Cataracts, bilateral   . Hyperlipidemia   . Night sweats   . Palpitations   . Thyroid disease 1976    Family History  Problem Relation Age of Onset  . Cancer Mother        uterine  . Breast cancer Mother   . Other Father         heart bypass   . Heart disease Father   . Colon cancer Father        pt undergoing questionable colon cancer workup at time of death    Past Surgical History:  Procedure Laterality Date  . BREAST BIOPSY  2012   left  . BREAST EXCISIONAL BIOPSY Left 07/2010  . CATARACT EXTRACTION    . CESAREAN SECTION    .  detached retina repair  2014  . FOOT SURGERY     right  . LAPAROSCOPIC OOPHERECTOMY Left    Social History   Occupational History  .  Unemployed   Social History Main Topics  . Smoking status: Never Smoker  . Smokeless tobacco: Never Used  . Alcohol use 3.0 - 4.2 oz/week    5 - 7 Glasses of wine per week  . Drug use: No  . Sexual activity: Yes

## 2017-03-19 MED FILL — ATORVASTATIN 10 MG TABLET: 10 | 30 days supply | Qty: 30 | Fill #1

## 2017-03-31 ENCOUNTER — Encounter (INDEPENDENT_AMBULATORY_CARE_PROVIDER_SITE_OTHER): Payer: Self-pay | Admitting: Orthopedic Surgery

## 2017-03-31 ENCOUNTER — Ambulatory Visit (INDEPENDENT_AMBULATORY_CARE_PROVIDER_SITE_OTHER): Payer: 59

## 2017-03-31 ENCOUNTER — Ambulatory Visit (INDEPENDENT_AMBULATORY_CARE_PROVIDER_SITE_OTHER): Payer: 59 | Admitting: Orthopedic Surgery

## 2017-03-31 DIAGNOSIS — M7662 Achilles tendinitis, left leg: Secondary | ICD-10-CM | POA: Diagnosis not present

## 2017-03-31 DIAGNOSIS — G8929 Other chronic pain: Secondary | ICD-10-CM

## 2017-03-31 DIAGNOSIS — M84374D Stress fracture, right foot, subsequent encounter for fracture with routine healing: Secondary | ICD-10-CM | POA: Diagnosis not present

## 2017-03-31 DIAGNOSIS — M25572 Pain in left ankle and joints of left foot: Secondary | ICD-10-CM | POA: Diagnosis not present

## 2017-03-31 MED ORDER — DICLOFENAC SODIUM 1 % TD GEL: 2.0000 g | Freq: Four times a day (QID) | TRANSDERMAL | 3 refills | Status: AC | PRN

## 2017-03-31 MED FILL — DICLOFENAC SODIUM 1% GEL: 1 | 12 days supply | Qty: 100 | Fill #0

## 2017-03-31 MED FILL — RESTASIS 0.05% EYE EMULSION: 0.05 | 90 days supply | Qty: 180 | Fill #2

## 2017-03-31 NOTE — Progress Notes (Signed)
Office Visit Note   Patient: Jessica Herrera           Date of Birth: [birth year]           MRN: [id number] Visit Date: 03/31/2017              Requested by: Reynold Bowen, [place], [place] [address] PCP: Reynold Bowen, MD  Chief Complaint  Patient presents with  . Left Ankle - Pain  . Right Foot - Follow-up      HPI: Patient presents in follow-up for 2 separate issues #1 second metatarsal stress fracture of the right foot and left posterior Achilles tendinitis. Patient denies any trauma denies any history recently of fluoroquinolone use.  Assessment & Plan: Visit Diagnoses:  1. Stress fracture of right foot with routine healing   2. Chronic pain of left ankle   3. Achilles tendinitis, left leg     Plan: Patient was given instructions for heel cord stretching for the Achilles tendinitis she was given 2 heel lift to help with the Achilles pain during normal activities. A prescription for Voltaren gel and she will resume regular shoe wear.  Follow-Up Instructions: Return if symptoms worsen or fail to improve.   Ortho Exam  Patient is alert, oriented, no adenopathy, well-dressed, normal affect, normal respiratory effort. Examination patient has good pulses she does have some swelling in the right foot. She has dorsiflexion to neutral with a little bit of heel cord tightness. The fracture site is nontender to palpation radiograph shows excellent callus formation. Examination the left foot she also has some heel cord tightness good pulses she does have some very mild nodular Achilles change at the watershed region. There is no redness no cellulitis no open wound. There is no defect of the Achilles.  Imaging: Xr Ankle 2 Views Left  Result Date: 03/31/2017 Two-view radiographs of the left ankle show no Haglund deformity. There is some fullness in the soft tissue posteriorly over the Achilles.  Xr Foot Complete Right  Result Date: 03/31/2017 Three-view  radiographs of the right foot shows shortening of the second metatarsal. There is good callus formation around the fracture site. There is no varus or valgus malalignment.  No images are attached to the encounter.  Labs: No results found for: HGBA1C, ESRSEDRATE, CRP, LABURIC, REPTSTATUS, GRAMSTAIN, CULT, LABORGA  Orders:  Orders Placed This Encounter  Procedures  . XR Ankle 2 Views Left  . XR Foot Complete Right   Meds ordered this encounter  Medications  . diclofenac sodium (VOLTAREN) 1 % GEL    Sig: Apply 2 g topically 4 (four) times daily as needed.    Dispense:  100 g    Refill:  3     Procedures: No procedures performed  Clinical Data: No additional findings.  ROS:  All other systems negative, except as noted in the HPI. Review of Systems  Objective: Vital Signs: There were no vitals taken for this visit.  Specialty Comments:  No specialty comments available.  PMFS History: Patient Active Problem List   Diagnosis Date Noted  . Achilles tendinitis, left leg 03/31/2017  . Metatarsal stress fracture, right, initial encounter 03/02/2017  . Heart palpitations, with dizziness 04/07/2013  . B12 DEFICIENCY 02/08/2010  . LIVER HEMANGIOMA 01/31/2010  . LIVER MASS 01/31/2010  . HYPOTHYROIDISM 01/31/2010  . DIVERTICULOSIS OF COLON 01/31/2010   Past Medical History:  Diagnosis Date  . Arthritis    wrists  . Cataracts, bilateral   . Hyperlipidemia   .  Night sweats   . Palpitations   . Thyroid disease 1976    Family History  Problem Relation Age of Onset  . Cancer Mother        uterine  . Breast cancer Mother   . Other Father         heart bypass   . Heart disease Father   . Colon cancer Father        pt undergoing questionable colon cancer workup at time of death    Past Surgical History:  Procedure Laterality Date  . BREAST BIOPSY  2012   left  . BREAST EXCISIONAL BIOPSY Left 07/2010  . CATARACT EXTRACTION    . CESAREAN SECTION    . detached  retina repair  2014  . FOOT SURGERY     right  . LAPAROSCOPIC OOPHERECTOMY Left    Social History   Occupational History  .  Unemployed   Social History Main Topics  . Smoking status: Never Smoker  . Smokeless tobacco: Never Used  . Alcohol use 3.0 - 4.2 oz/week    5 - 7 Glasses of wine per week  . Drug use: No  . Sexual activity: Yes

## 2017-04-08 DIAGNOSIS — Z01419 Encounter for gynecological examination (general) (routine) without abnormal findings: Secondary | ICD-10-CM | POA: Diagnosis not present

## 2017-04-08 DIAGNOSIS — Z13820 Encounter for screening for osteoporosis: Secondary | ICD-10-CM | POA: Diagnosis not present

## 2017-04-08 DIAGNOSIS — Z6824 Body mass index (BMI) 24.0-24.9, adult: Secondary | ICD-10-CM | POA: Diagnosis not present

## 2017-04-13 ENCOUNTER — Encounter (INDEPENDENT_AMBULATORY_CARE_PROVIDER_SITE_OTHER): Payer: 59 | Admitting: Ophthalmology

## 2017-04-16 ENCOUNTER — Encounter (INDEPENDENT_AMBULATORY_CARE_PROVIDER_SITE_OTHER): Payer: 59 | Admitting: Ophthalmology

## 2017-04-20 MED FILL — SYNTHROID 100 MCG TABLET: 100 | 90 days supply | Qty: 90 | Fill #1

## 2017-04-20 MED FILL — ATORVASTATIN 10 MG TABLET: 10 | 30 days supply | Qty: 30 | Fill #2

## 2017-05-12 ENCOUNTER — Other Ambulatory Visit (INDEPENDENT_AMBULATORY_CARE_PROVIDER_SITE_OTHER): Payer: Self-pay | Admitting: Orthopedic Surgery

## 2017-05-12 MED ORDER — MUPIROCIN 2 % EX OINT: 1.0000 "application " | TOPICAL_OINTMENT | Freq: Two times a day (BID) | CUTANEOUS | 3 refills | Status: DC

## 2017-05-12 MED ORDER — DOXYCYCLINE HYCLATE 100 MG PO TABS: 100.0000 mg | ORAL_TABLET | Freq: Two times a day (BID) | ORAL | 0 refills | Status: DC

## 2017-05-12 MED FILL — MUPIROCIN 2% OINTMENT: 2 | 7 days supply | Qty: 22 | Fill #0

## 2017-05-12 MED FILL — DOXYCYCLINE HYCLATE 100 MG: 100 | 10 days supply | Qty: 20 | Fill #0

## 2017-05-18 MED FILL — ATORVASTATIN 10 MG TABLET: 10 | 30 days supply | Qty: 30 | Fill #0

## 2017-06-03 DIAGNOSIS — Z0000 Encounter for general adult medical examination without abnormal findings: Secondary | ICD-10-CM | POA: Diagnosis not present

## 2017-06-03 DIAGNOSIS — E038 Other specified hypothyroidism: Secondary | ICD-10-CM | POA: Diagnosis not present

## 2017-06-10 DIAGNOSIS — Z0000 Encounter for general adult medical examination without abnormal findings: Secondary | ICD-10-CM | POA: Diagnosis not present

## 2017-06-10 DIAGNOSIS — Z1389 Encounter for screening for other disorder: Secondary | ICD-10-CM | POA: Diagnosis not present

## 2017-06-10 DIAGNOSIS — D649 Anemia, unspecified: Secondary | ICD-10-CM | POA: Diagnosis not present

## 2017-06-10 DIAGNOSIS — E038 Other specified hypothyroidism: Secondary | ICD-10-CM | POA: Diagnosis not present

## 2017-06-10 DIAGNOSIS — D1800 Hemangioma unspecified site: Secondary | ICD-10-CM | POA: Diagnosis not present

## 2017-06-10 DIAGNOSIS — Z23 Encounter for immunization: Secondary | ICD-10-CM | POA: Diagnosis not present

## 2017-06-10 DIAGNOSIS — K635 Polyp of colon: Secondary | ICD-10-CM | POA: Diagnosis not present

## 2017-06-22 MED FILL — ATORVASTATIN 10 MG TABLET: 10 | 30 days supply | Qty: 30 | Fill #1

## 2017-06-24 DIAGNOSIS — L821 Other seborrheic keratosis: Secondary | ICD-10-CM | POA: Diagnosis not present

## 2017-06-24 DIAGNOSIS — L111 Transient acantholytic dermatosis [Grover]: Secondary | ICD-10-CM | POA: Diagnosis not present

## 2017-06-24 DIAGNOSIS — L723 Sebaceous cyst: Secondary | ICD-10-CM | POA: Diagnosis not present

## 2017-06-24 DIAGNOSIS — D1801 Hemangioma of skin and subcutaneous tissue: Secondary | ICD-10-CM | POA: Diagnosis not present

## 2017-07-14 DIAGNOSIS — Z23 Encounter for immunization: Secondary | ICD-10-CM | POA: Diagnosis not present

## 2017-07-20 MED FILL — ATORVASTATIN 10 MG TABLET: 10 | 30 days supply | Qty: 30 | Fill #2

## 2017-07-20 MED FILL — SYNTHROID 100 MCG TABLET: 100 | 90 days supply | Qty: 90 | Fill #2

## 2017-08-20 MED FILL — ATORVASTATIN 10 MG TABLET: 10 | 30 days supply | Qty: 30 | Fill #0

## 2017-08-26 MED FILL — PROLENSA 0.07% EYE DROPS: 0.07 | 90 days supply | Qty: 9 | Fill #0

## 2017-09-08 ENCOUNTER — Other Ambulatory Visit: Payer: Self-pay | Admitting: Obstetrics and Gynecology

## 2017-09-08 DIAGNOSIS — Z139 Encounter for screening, unspecified: Secondary | ICD-10-CM

## 2017-09-23 MED FILL — ATORVASTATIN 10 MG TABLET: 10 | 30 days supply | Qty: 30 | Fill #1

## 2017-10-07 ENCOUNTER — Ambulatory Visit
Admission: RE | Admit: 2017-10-07 | Discharge: 2017-10-07 | Disposition: A | Discharge status: Home / Self Care | Payer: 59 | Source: Ambulatory Visit | Attending: Obstetrics and Gynecology | Admitting: Obstetrics and Gynecology

## 2017-10-07 DIAGNOSIS — Z1231 Encounter for screening mammogram for malignant neoplasm of breast: Secondary | ICD-10-CM | POA: Diagnosis not present

## 2017-10-07 DIAGNOSIS — Z139 Encounter for screening, unspecified: Secondary | ICD-10-CM

## 2017-10-12 ENCOUNTER — Encounter (INDEPENDENT_AMBULATORY_CARE_PROVIDER_SITE_OTHER): Payer: 59 | Admitting: Ophthalmology

## 2017-10-13 MED FILL — ADVAIR 100/50 DISKUS: 100-50 | 30 days supply | Qty: 60 | Fill #0

## 2017-10-19 ENCOUNTER — Encounter (INDEPENDENT_AMBULATORY_CARE_PROVIDER_SITE_OTHER): Payer: 59 | Admitting: Ophthalmology

## 2017-10-20 MED FILL — SYNTHROID 100 MCG TABLET: 100 | 90 days supply | Qty: 90 | Fill #0

## 2017-10-20 MED FILL — ATORVASTATIN 10 MG TABLET: 10 | 30 days supply | Qty: 30 | Fill #2

## 2017-10-21 MED FILL — AMOXICILLIN-POT CLAVULANATE: 875-125 | 7 days supply | Qty: 14 | Fill #0

## 2017-10-26 MED FILL — RESTASIS 0.05% EYE EMULSION: 0.05 | 90 days supply | Qty: 180 | Fill #0

## 2017-11-27 MED FILL — ATORVASTATIN 10 MG TABLET: 10 | 30 days supply | Qty: 30 | Fill #0

## 2017-12-08 DIAGNOSIS — D649 Anemia, unspecified: Secondary | ICD-10-CM | POA: Diagnosis not present

## 2017-12-08 DIAGNOSIS — K635 Polyp of colon: Secondary | ICD-10-CM | POA: Diagnosis not present

## 2017-12-08 DIAGNOSIS — Z6824 Body mass index (BMI) 24.0-24.9, adult: Secondary | ICD-10-CM | POA: Diagnosis not present

## 2017-12-08 DIAGNOSIS — D1800 Hemangioma unspecified site: Secondary | ICD-10-CM | POA: Diagnosis not present

## 2017-12-08 DIAGNOSIS — E038 Other specified hypothyroidism: Secondary | ICD-10-CM | POA: Diagnosis not present

## 2017-12-08 DIAGNOSIS — Z1389 Encounter for screening for other disorder: Secondary | ICD-10-CM | POA: Diagnosis not present

## 2017-12-29 MED FILL — ATORVASTATIN 10 MG TABLET: 10 | 30 days supply | Qty: 30 | Fill #1

## 2018-01-21 MED FILL — SYNTHROID 100 MCG TABLET: 100 | 90 days supply | Qty: 90 | Fill #1

## 2018-02-01 MED FILL — ATORVASTATIN 10 MG TABLET: 10 | 30 days supply | Qty: 30 | Fill #2

## 2018-02-18 ENCOUNTER — Telehealth: Payer: Self-pay | Admitting: Gastroenterology

## 2018-02-18 ENCOUNTER — Encounter: Payer: Self-pay | Admitting: Gastroenterology

## 2018-02-18 ENCOUNTER — Ambulatory Visit: Payer: 59 | Admitting: Gastroenterology

## 2018-02-18 VITALS — BP 132/82 | HR 67 | Ht 66.0 in | Wt 145.0 lb

## 2018-02-18 DIAGNOSIS — R1032 Left lower quadrant pain: Secondary | ICD-10-CM | POA: Diagnosis not present

## 2018-02-18 MED ORDER — CIPROFLOXACIN HCL 500 MG PO TABS: 500.0000 mg | ORAL_TABLET | Freq: Two times a day (BID) | ORAL | 0 refills | Status: DC

## 2018-02-18 MED ORDER — METRONIDAZOLE 500 MG PO TABS: 500.0000 mg | ORAL_TABLET | Freq: Three times a day (TID) | ORAL | 0 refills | Status: DC

## 2018-02-18 MED FILL — CIPROFLOXACIN HCL 500 MG TA: 500 | 10 days supply | Qty: 20 | Fill #0

## 2018-02-18 MED FILL — metroNIDAZOLE 500 MG TABS: 500 | 10 days supply | Qty: 30 | Fill #0

## 2018-02-18 NOTE — Progress Notes (Signed)
02/18/2018 Jessica Herrera [id number] October 12, 1955   HISTORY OF PRESENT ILLNESS:  This is a 62 year old female who is a patient of Dr. Doyne Keel.  She is known to him only for colonoscopy in 08/2016 at which time she was found to have multiple medium mouthed diverticula noted as well as a couple of polyps that were removed (hyperplastic on pathology).  She presents to our office today with complaints of LLQ abdominal pain that has been present since Monday morning.  Was tolerable until yesterday evening when it became more severe.  Now hurts in entire lower abdomen.  No nausea, vomiting, fever, chills.     Past Medical History:  Diagnosis Date  . Arthritis    wrists  . Cataracts, bilateral   . Hyperlipidemia   . Night sweats   . Palpitations   . Thyroid disease 1976   Past Surgical History:  Procedure Laterality Date  . BREAST BIOPSY  2012   left  . BREAST EXCISIONAL BIOPSY Left 07/2010  . CATARACT EXTRACTION    . CESAREAN SECTION    . detached retina repair  2014  . FOOT SURGERY     right  . LAPAROSCOPIC OOPHERECTOMY Left     reports that she has never smoked. She has never used smokeless tobacco. She reports that she drinks about 3.0 - 4.2 oz of alcohol per week. She reports that she does not use drugs. family history includes Breast cancer in her mother; Cancer in her mother; Colon cancer in her father; Heart disease in her father; Other in her father. No Known Allergies    Outpatient Encounter Medications as of 02/18/2018  Medication Sig  . atorvastatin (LIPITOR) 10 MG tablet Take 10 mg by mouth daily.  . calcium carbonate (TUMS - DOSED IN MG ELEMENTAL CALCIUM) 500 MG chewable tablet Chew 1 tablet by mouth daily. Three daily  . cycloSPORINE (RESTASIS) 0.05 % ophthalmic emulsion 1 drop 2 (two) times daily.  Marland Kitchen ibuprofen (ADVIL,MOTRIN) 200 MG tablet Take 200 mg by mouth every 6 (six) hours as needed for pain.  . Lactobacillus Rhamnosus, GG, (CULTURELLE PO) Take by  mouth.  . SYNTHROID 100 MCG tablet 1 mcg Daily.  . [DISCONTINUED] doxycycline (VIBRA-TABS) 100 MG tablet Take 1 tablet (100 mg total) by mouth 2 (two) times daily. (Patient not taking: Reported on 02/18/2018)  . [DISCONTINUED] fluticasone (FLONASE) 50 MCG/ACT nasal spray 2 sprays by Each Nare route daily.  . [DISCONTINUED] mupirocin ointment (BACTROBAN) 2 % Apply 1 application topically 2 (two) times daily. Apply to the affected area 2 times a day (Patient not taking: Reported on 02/18/2018)   Facility-Administered Encounter Medications as of 02/18/2018  Medication  . 0.9 %  sodium chloride infusion  . 0.9 %  sodium chloride infusion     REVIEW OF SYSTEMS  : All other systems reviewed and negative except where noted in the History of Present Illness.   PHYSICAL EXAM: BP 132/82   Pulse 67   Ht 5\' 6"  (1.676 m)   Wt 145 lb (65.8 kg)   SpO2 97%   BMI 23.40 kg/m  General: Well developed white female in no acute distress Head: Normocephalic and atraumatic Eyes:  Sclerae anicteric, conjunctiva pink. Ears: Normal auditory acuity Lungs: Clear throughout to auscultation; no increased WOB. Heart: Regular rate and rhythm; no M/R/G. Abdomen: Soft, non-distended.  BS present.  Moderate LLQ TTP. Musculoskeletal: Symmetrical with no gross deformities  Skin: No lesions on visible extremities Extremities: No edema  Neurological: Alert oriented x 4, grossly non-focal Psychological:  Alert and cooperative. Normal mood and affect  ASSESSMENT AND PLAN: *LLQ abdominal pain:  Pain for the past 4 days.  Suspect diverticulitis.  Will treat with cipro 500 mg BID and flagyl 500 mg TID for 10 days.  Low fiber/bland diet for the next several days.  Go to ED if pain worsens or develops fever, etc.  If pain does not resolve with antibiotics then will need CT scan.   CC:  Reynold Bowen, MD

## 2018-02-18 NOTE — Telephone Encounter (Signed)
Thanks for letting me know Jessica Herrera, agree she should be seen if openings available.

## 2018-02-18 NOTE — Progress Notes (Signed)
Agree with assessment and plan as outlined.  

## 2018-02-18 NOTE — Patient Instructions (Addendum)
We have sent the following medications to your pharmacy for you to pick up at your convenience:  Flagyl 500 mg twice a day x 10 days Cipro 500 mg twice a day x 10 days  Soft, bland, low fiber diet.   Call back if your symptoms fall to improve.

## 2018-02-18 NOTE — Telephone Encounter (Signed)
Patient has been having increased LLQ abdominal pain for approximately 5 days. Denies any fever, slight constipation but still having BM's, no blood in stool, denies N/V. I have put her on JZ schedule to be seen today.

## 2018-03-04 MED FILL — ATORVASTATIN 10 MG TABLET: 10 | 90 days supply | Qty: 90 | Fill #0

## 2018-04-12 DIAGNOSIS — Z6824 Body mass index (BMI) 24.0-24.9, adult: Secondary | ICD-10-CM | POA: Diagnosis not present

## 2018-04-12 DIAGNOSIS — Z01419 Encounter for gynecological examination (general) (routine) without abnormal findings: Secondary | ICD-10-CM | POA: Diagnosis not present

## 2018-04-12 MED FILL — predniSONE 20 MG TABS: 20 | 6 days supply | Qty: 12 | Fill #0

## 2018-04-12 MED FILL — RESTASIS 0.05% EYE EMULSION: 0.05 | 90 days supply | Qty: 180 | Fill #1

## 2018-04-19 ENCOUNTER — Encounter (INDEPENDENT_AMBULATORY_CARE_PROVIDER_SITE_OTHER): Payer: 59 | Admitting: Ophthalmology

## 2018-04-21 ENCOUNTER — Encounter (INDEPENDENT_AMBULATORY_CARE_PROVIDER_SITE_OTHER): Payer: 59 | Admitting: Ophthalmology

## 2018-04-22 ENCOUNTER — Encounter (INDEPENDENT_AMBULATORY_CARE_PROVIDER_SITE_OTHER): Payer: 59 | Admitting: Ophthalmology

## 2018-04-23 MED FILL — SYNTHROID 100 MCG TABLET: 100 | 90 days supply | Qty: 90 | Fill #2

## 2018-04-27 DIAGNOSIS — M542 Cervicalgia: Secondary | ICD-10-CM | POA: Diagnosis not present

## 2018-04-27 DIAGNOSIS — M25511 Pain in right shoulder: Secondary | ICD-10-CM | POA: Diagnosis not present

## 2018-04-27 DIAGNOSIS — M4722 Other spondylosis with radiculopathy, cervical region: Secondary | ICD-10-CM | POA: Diagnosis not present

## 2018-04-27 MED FILL — predniSONE 5 MG TABS: 5 | 12 days supply | Qty: 48 | Fill #0

## 2018-05-01 DIAGNOSIS — Z23 Encounter for immunization: Secondary | ICD-10-CM | POA: Diagnosis not present

## 2018-05-11 DIAGNOSIS — M5412 Radiculopathy, cervical region: Secondary | ICD-10-CM | POA: Diagnosis not present

## 2018-05-19 DIAGNOSIS — M5412 Radiculopathy, cervical region: Secondary | ICD-10-CM | POA: Diagnosis not present

## 2018-05-24 MED FILL — predniSONE 20 MG TABS: 20 | 6 days supply | Qty: 12 | Fill #1

## 2018-05-26 DIAGNOSIS — M5412 Radiculopathy, cervical region: Secondary | ICD-10-CM | POA: Diagnosis not present

## 2018-05-31 DIAGNOSIS — M5030 Other cervical disc degeneration, unspecified cervical region: Secondary | ICD-10-CM | POA: Diagnosis not present

## 2018-05-31 DIAGNOSIS — M4722 Other spondylosis with radiculopathy, cervical region: Secondary | ICD-10-CM | POA: Diagnosis not present

## 2018-06-02 DIAGNOSIS — M5412 Radiculopathy, cervical region: Secondary | ICD-10-CM | POA: Diagnosis not present

## 2018-06-08 DIAGNOSIS — E038 Other specified hypothyroidism: Secondary | ICD-10-CM | POA: Diagnosis not present

## 2018-06-08 DIAGNOSIS — Z0000 Encounter for general adult medical examination without abnormal findings: Secondary | ICD-10-CM | POA: Diagnosis not present

## 2018-06-08 DIAGNOSIS — R82998 Other abnormal findings in urine: Secondary | ICD-10-CM | POA: Diagnosis not present

## 2018-06-09 MED FILL — ATORVASTATIN 10 MG TABLET: 10 | 90 days supply | Qty: 90 | Fill #1

## 2018-06-15 DIAGNOSIS — D1800 Hemangioma unspecified site: Secondary | ICD-10-CM | POA: Diagnosis not present

## 2018-06-15 DIAGNOSIS — E038 Other specified hypothyroidism: Secondary | ICD-10-CM | POA: Diagnosis not present

## 2018-06-15 DIAGNOSIS — R1084 Generalized abdominal pain: Secondary | ICD-10-CM | POA: Diagnosis not present

## 2018-06-15 DIAGNOSIS — K635 Polyp of colon: Secondary | ICD-10-CM | POA: Diagnosis not present

## 2018-06-15 DIAGNOSIS — Z6824 Body mass index (BMI) 24.0-24.9, adult: Secondary | ICD-10-CM | POA: Diagnosis not present

## 2018-06-15 DIAGNOSIS — Z0000 Encounter for general adult medical examination without abnormal findings: Secondary | ICD-10-CM | POA: Diagnosis not present

## 2018-06-15 DIAGNOSIS — D6489 Other specified anemias: Secondary | ICD-10-CM | POA: Diagnosis not present

## 2018-06-15 DIAGNOSIS — Z1389 Encounter for screening for other disorder: Secondary | ICD-10-CM | POA: Diagnosis not present

## 2018-06-16 DIAGNOSIS — Z1212 Encounter for screening for malignant neoplasm of rectum: Secondary | ICD-10-CM | POA: Diagnosis not present

## 2018-06-30 DIAGNOSIS — D1801 Hemangioma of skin and subcutaneous tissue: Secondary | ICD-10-CM | POA: Diagnosis not present

## 2018-06-30 DIAGNOSIS — L821 Other seborrheic keratosis: Secondary | ICD-10-CM | POA: Diagnosis not present

## 2018-06-30 DIAGNOSIS — D225 Melanocytic nevi of trunk: Secondary | ICD-10-CM | POA: Diagnosis not present

## 2018-07-12 MED FILL — RESTASIS 0.05% EYE EMULSION: 0.05 | 90 days supply | Qty: 180 | Fill #2

## 2018-07-12 MED FILL — predniSONE 20 MG TABS: 20 | 6 days supply | Qty: 12 | Fill #2

## 2018-07-15 MED FILL — SYNTHROID 100 MCG TABLET: 100 | 90 days supply | Qty: 90 | Fill #0

## 2018-08-10 MED FILL — predniSONE 5 MG TABS: 5 | 12 days supply | Qty: 48 | Fill #0

## 2018-09-06 ENCOUNTER — Encounter (INDEPENDENT_AMBULATORY_CARE_PROVIDER_SITE_OTHER): Payer: 59 | Admitting: Ophthalmology

## 2018-09-06 MED FILL — ATORVASTATIN 10 MG TABLET: 10 | 90 days supply | Qty: 90 | Fill #0

## 2018-09-07 ENCOUNTER — Other Ambulatory Visit: Payer: Self-pay | Admitting: Obstetrics and Gynecology

## 2018-09-07 DIAGNOSIS — Z1231 Encounter for screening mammogram for malignant neoplasm of breast: Secondary | ICD-10-CM

## 2018-09-09 ENCOUNTER — Encounter (INDEPENDENT_AMBULATORY_CARE_PROVIDER_SITE_OTHER): Payer: Self-pay | Admitting: Ophthalmology

## 2018-09-20 MED FILL — PROLENSA 0.07% EYE DROPS: 0.07 | 60 days supply | Qty: 3 | Fill #0

## 2018-09-20 MED FILL — DUREZOL 0.05% EYE DROPS: 0.05 | 45 days supply | Qty: 5 | Fill #0

## 2018-10-11 ENCOUNTER — Ambulatory Visit
Admission: RE | Admit: 2018-10-11 | Discharge: 2018-10-11 | Disposition: A | Discharge status: Home / Self Care | Payer: 59 | Source: Ambulatory Visit | Attending: Obstetrics and Gynecology | Admitting: Obstetrics and Gynecology

## 2018-10-11 ENCOUNTER — Other Ambulatory Visit: Payer: Self-pay

## 2018-10-11 DIAGNOSIS — Z1231 Encounter for screening mammogram for malignant neoplasm of breast: Secondary | ICD-10-CM | POA: Diagnosis not present

## 2018-10-19 MED FILL — SYNTHROID 100 MCG TABLET: 100 | 90 days supply | Qty: 90 | Fill #0

## 2018-10-19 MED FILL — predniSONE 20 MG TABS: 20 | 6 days supply | Qty: 12 | Fill #0

## 2018-12-01 MED FILL — ATORVASTATIN 10 MG TABLET: 10 | 90 days supply | Qty: 90 | Fill #0

## 2018-12-15 DIAGNOSIS — M5412 Radiculopathy, cervical region: Secondary | ICD-10-CM | POA: Diagnosis not present

## 2018-12-15 DIAGNOSIS — D1800 Hemangioma unspecified site: Secondary | ICD-10-CM | POA: Diagnosis not present

## 2018-12-15 DIAGNOSIS — D649 Anemia, unspecified: Secondary | ICD-10-CM | POA: Diagnosis not present

## 2018-12-15 DIAGNOSIS — K635 Polyp of colon: Secondary | ICD-10-CM | POA: Diagnosis not present

## 2018-12-15 DIAGNOSIS — E039 Hypothyroidism, unspecified: Secondary | ICD-10-CM | POA: Diagnosis not present

## 2018-12-15 DIAGNOSIS — E538 Deficiency of other specified B group vitamins: Secondary | ICD-10-CM | POA: Diagnosis not present

## 2018-12-15 DIAGNOSIS — Z1331 Encounter for screening for depression: Secondary | ICD-10-CM | POA: Diagnosis not present

## 2019-01-12 MED FILL — SYNTHROID 100 MCG TABLET: 100 | 90 days supply | Qty: 90 | Fill #0

## 2019-03-01 MED FILL — ATORVASTATIN 10 MG TABLET: 10 | 90 days supply | Qty: 90 | Fill #0

## 2019-03-01 MED FILL — predniSONE 20 MG TABS: 20 | 6 days supply | Qty: 12 | Fill #0

## 2019-03-14 MED FILL — RESTASIS 0.05% EYE EMULSION: 0.05 | 90 days supply | Qty: 180 | Fill #0

## 2019-03-24 ENCOUNTER — Encounter (INDEPENDENT_AMBULATORY_CARE_PROVIDER_SITE_OTHER): Payer: 59 | Admitting: Ophthalmology

## 2019-03-31 ENCOUNTER — Encounter (INDEPENDENT_AMBULATORY_CARE_PROVIDER_SITE_OTHER): Payer: 59 | Admitting: Ophthalmology

## 2019-04-05 ENCOUNTER — Encounter (INDEPENDENT_AMBULATORY_CARE_PROVIDER_SITE_OTHER): Payer: 59 | Admitting: Ophthalmology

## 2019-04-05 MED FILL — CHLORHEXIDINE 0.12% RINSE: 0.12 | 15 days supply | Qty: 473 | Fill #0

## 2019-04-05 MED FILL — AMOX-CLAV 875-125 MG TABLET: 875-125 | 10 days supply | Qty: 20 | Fill #0

## 2019-04-14 DIAGNOSIS — Z01419 Encounter for gynecological examination (general) (routine) without abnormal findings: Secondary | ICD-10-CM | POA: Diagnosis not present

## 2019-04-14 DIAGNOSIS — Z6824 Body mass index (BMI) 24.0-24.9, adult: Secondary | ICD-10-CM | POA: Diagnosis not present

## 2019-04-19 MED FILL — AMOX-CLAV 875-125 MG TABLET: 875-125 | 10 days supply | Qty: 20 | Fill #1

## 2019-04-19 MED FILL — SYNTHROID 100 MCG TABLET: 100 | 90 days supply | Qty: 90 | Fill #0

## 2019-04-26 DIAGNOSIS — R030 Elevated blood-pressure reading, without diagnosis of hypertension: Secondary | ICD-10-CM | POA: Diagnosis not present

## 2019-04-26 DIAGNOSIS — Z23 Encounter for immunization: Secondary | ICD-10-CM | POA: Diagnosis not present

## 2019-04-26 DIAGNOSIS — E039 Hypothyroidism, unspecified: Secondary | ICD-10-CM | POA: Diagnosis not present

## 2019-04-26 MED FILL — LOSARTAN POTASSIUM 25 MG TA: 25 | 30 days supply | Qty: 30 | Fill #0

## 2019-05-24 MED FILL — LOSARTAN POTASSIUM 25 MG TA: 25 | 30 days supply | Qty: 30 | Fill #1

## 2019-05-26 DIAGNOSIS — R030 Elevated blood-pressure reading, without diagnosis of hypertension: Secondary | ICD-10-CM | POA: Diagnosis not present

## 2019-05-30 DIAGNOSIS — R030 Elevated blood-pressure reading, without diagnosis of hypertension: Secondary | ICD-10-CM | POA: Diagnosis not present

## 2019-06-01 MED FILL — ATORVASTATIN 10 MG TABLET: 10 | 90 days supply | Qty: 90 | Fill #0

## 2019-06-15 DIAGNOSIS — E538 Deficiency of other specified B group vitamins: Secondary | ICD-10-CM | POA: Diagnosis not present

## 2019-06-15 DIAGNOSIS — Z0000 Encounter for general adult medical examination without abnormal findings: Secondary | ICD-10-CM | POA: Diagnosis not present

## 2019-06-15 DIAGNOSIS — E038 Other specified hypothyroidism: Secondary | ICD-10-CM | POA: Diagnosis not present

## 2019-06-17 DIAGNOSIS — R82998 Other abnormal findings in urine: Secondary | ICD-10-CM | POA: Diagnosis not present

## 2019-06-20 DIAGNOSIS — Z0000 Encounter for general adult medical examination without abnormal findings: Secondary | ICD-10-CM | POA: Diagnosis not present

## 2019-06-20 DIAGNOSIS — E039 Hypothyroidism, unspecified: Secondary | ICD-10-CM | POA: Diagnosis not present

## 2019-06-20 DIAGNOSIS — I10 Essential (primary) hypertension: Secondary | ICD-10-CM | POA: Diagnosis not present

## 2019-06-20 DIAGNOSIS — D649 Anemia, unspecified: Secondary | ICD-10-CM | POA: Diagnosis not present

## 2019-06-20 DIAGNOSIS — K635 Polyp of colon: Secondary | ICD-10-CM | POA: Diagnosis not present

## 2019-06-20 DIAGNOSIS — R109 Unspecified abdominal pain: Secondary | ICD-10-CM | POA: Diagnosis not present

## 2019-06-20 DIAGNOSIS — D1803 Hemangioma of intra-abdominal structures: Secondary | ICD-10-CM | POA: Diagnosis not present

## 2019-06-20 DIAGNOSIS — E538 Deficiency of other specified B group vitamins: Secondary | ICD-10-CM | POA: Diagnosis not present

## 2019-06-20 MED FILL — LOSARTAN POTASSIUM 25 MG TA: 25 | 90 days supply | Qty: 90 | Fill #0

## 2019-06-22 ENCOUNTER — Other Ambulatory Visit: Payer: Self-pay | Admitting: Endocrinology

## 2019-06-22 DIAGNOSIS — D1803 Hemangioma of intra-abdominal structures: Secondary | ICD-10-CM

## 2019-06-22 DIAGNOSIS — R109 Unspecified abdominal pain: Secondary | ICD-10-CM

## 2019-06-29 MED FILL — RESTASIS 0.05% EYE EMULSION: 0.05 | 90 days supply | Qty: 180 | Fill #1

## 2019-07-04 ENCOUNTER — Ambulatory Visit
Admission: RE | Admit: 2019-07-04 | Discharge: 2019-07-04 | Disposition: A | Discharge status: Home / Self Care | Payer: 59 | Source: Ambulatory Visit | Attending: Endocrinology | Admitting: Endocrinology

## 2019-07-04 DIAGNOSIS — D1809 Hemangioma of other sites: Secondary | ICD-10-CM | POA: Diagnosis not present

## 2019-07-04 DIAGNOSIS — K5730 Diverticulosis of large intestine without perforation or abscess without bleeding: Secondary | ICD-10-CM | POA: Diagnosis not present

## 2019-07-04 DIAGNOSIS — R109 Unspecified abdominal pain: Secondary | ICD-10-CM

## 2019-07-04 DIAGNOSIS — D1803 Hemangioma of intra-abdominal structures: Secondary | ICD-10-CM

## 2019-07-04 DIAGNOSIS — K760 Fatty (change of) liver, not elsewhere classified: Secondary | ICD-10-CM | POA: Diagnosis not present

## 2019-07-04 MED ORDER — IOPAMIDOL (ISOVUE-300) INJECTION 61%
100.0000 mL | Freq: Once | INTRAVENOUS | Status: AC | PRN
  Administered 2019-07-04: 15:00:00 100 mL via INTRAVENOUS

## 2019-07-06 DIAGNOSIS — D225 Melanocytic nevi of trunk: Secondary | ICD-10-CM | POA: Diagnosis not present

## 2019-07-06 DIAGNOSIS — D2262 Melanocytic nevi of left upper limb, including shoulder: Secondary | ICD-10-CM | POA: Diagnosis not present

## 2019-07-06 DIAGNOSIS — L821 Other seborrheic keratosis: Secondary | ICD-10-CM | POA: Diagnosis not present

## 2019-07-06 DIAGNOSIS — L820 Inflamed seborrheic keratosis: Secondary | ICD-10-CM | POA: Diagnosis not present

## 2019-07-06 DIAGNOSIS — L570 Actinic keratosis: Secondary | ICD-10-CM | POA: Diagnosis not present

## 2019-07-06 DIAGNOSIS — L814 Other melanin hyperpigmentation: Secondary | ICD-10-CM | POA: Diagnosis not present

## 2019-07-06 DIAGNOSIS — L720 Epidermal cyst: Secondary | ICD-10-CM | POA: Diagnosis not present

## 2019-07-06 DIAGNOSIS — D2261 Melanocytic nevi of right upper limb, including shoulder: Secondary | ICD-10-CM | POA: Diagnosis not present

## 2019-07-06 DIAGNOSIS — D1801 Hemangioma of skin and subcutaneous tissue: Secondary | ICD-10-CM | POA: Diagnosis not present

## 2019-07-18 MED FILL — SYNTHROID 100 MCG TABLET: 100 | 90 days supply | Qty: 90 | Fill #1

## 2019-08-20 ENCOUNTER — Ambulatory Visit: Payer: 59 | Attending: Internal Medicine

## 2019-08-20 DIAGNOSIS — Z23 Encounter for immunization: Secondary | ICD-10-CM | POA: Insufficient documentation

## 2019-08-20 NOTE — Progress Notes (Signed)
   Covid-19 Vaccination Clinic  Name:  Jessica Herrera    MRN: [id number] DOB: Jul 18, 1956  08/20/2019  Ms. [name] was observed post Covid-19 immunization for 15 minutes without incidence. She was provided with Vaccine Information Sheet and instruction to access the V-Safe system.   Ms. [name] was instructed to call 911 with any severe reactions post vaccine: Marland Kitchen Difficulty breathing  . Swelling of your face and throat  . A fast heartbeat  . A bad rash all over your body  . Dizziness and weakness    Immunizations Administered    Name Date Dose VIS Date Route   Pfizer COVID-19 Vaccine 08/20/2019 11:23 AM 0.3 mL 07/08/2019 Intramuscular   Manufacturer: [place]   Lot: [id number]   [place]: [id number]

## 2019-08-30 ENCOUNTER — Other Ambulatory Visit: Payer: Self-pay | Admitting: Obstetrics and Gynecology

## 2019-08-30 DIAGNOSIS — Z1231 Encounter for screening mammogram for malignant neoplasm of breast: Secondary | ICD-10-CM

## 2019-08-31 MED FILL — ATORVASTATIN 10 MG TABLET: 10 | 90 days supply | Qty: 90 | Fill #1

## 2019-09-10 ENCOUNTER — Ambulatory Visit: Payer: 59 | Attending: Internal Medicine

## 2019-09-10 DIAGNOSIS — Z23 Encounter for immunization: Secondary | ICD-10-CM | POA: Insufficient documentation

## 2019-09-10 NOTE — Progress Notes (Signed)
   Covid-19 Vaccination Clinic  Name:  Jessica Herrera    MRN: [id number] DOB: 05/07/1956  09/10/2019  Jessica Herrera was observed post Covid-19 immunization for 15 minutes without incidence. She was provided with Vaccine Information Sheet and instruction to access the V-Safe system.   Jessica Herrera was instructed to call 911 with any severe reactions post vaccine: Marland Kitchen Difficulty breathing  . Swelling of your face and throat  . A fast heartbeat  . A bad rash all over your body  . Dizziness and weakness    Immunizations Administered    Name Date Dose VIS Date Route   Pfizer COVID-19 Vaccine 09/10/2019  9:48 AM 0.3 mL 07/08/2019 Intramuscular   Manufacturer: [place]   Lot: [id number]   [place]: [id number]

## 2019-09-20 ENCOUNTER — Encounter: Payer: Self-pay | Admitting: Gastroenterology

## 2019-09-20 MED FILL — LOSARTAN POTASSIUM 25 MG TA: 25 | 90 days supply | Qty: 90 | Fill #1

## 2019-09-21 ENCOUNTER — Other Ambulatory Visit (HOSPITAL_COMMUNITY): Payer: Self-pay | Admitting: Ophthalmology

## 2019-09-21 MED FILL — PROLENSA 0.07% EYE DROPS: 0.07 | 30 days supply | Qty: 3 | Fill #0

## 2019-09-21 MED FILL — DUREZOL 0.05% EYE DROPS: 0.05 | 50 days supply | Qty: 5 | Fill #0

## 2019-09-26 DIAGNOSIS — L821 Other seborrheic keratosis: Secondary | ICD-10-CM | POA: Diagnosis not present

## 2019-10-12 ENCOUNTER — Other Ambulatory Visit: Payer: Self-pay

## 2019-10-12 ENCOUNTER — Ambulatory Visit
Admission: RE | Admit: 2019-10-12 | Discharge: 2019-10-12 | Disposition: A | Discharge status: Home / Self Care | Payer: 59 | Source: Ambulatory Visit | Attending: Obstetrics and Gynecology | Admitting: Obstetrics and Gynecology

## 2019-10-12 DIAGNOSIS — Z1231 Encounter for screening mammogram for malignant neoplasm of breast: Secondary | ICD-10-CM | POA: Diagnosis not present

## 2019-10-17 MED FILL — SYNTHROID 100 MCG TABLET: 100 | 90 days supply | Qty: 90 | Fill #2

## 2019-10-19 DIAGNOSIS — K635 Polyp of colon: Secondary | ICD-10-CM | POA: Diagnosis not present

## 2019-10-19 DIAGNOSIS — E538 Deficiency of other specified B group vitamins: Secondary | ICD-10-CM | POA: Diagnosis not present

## 2019-10-19 DIAGNOSIS — D1803 Hemangioma of intra-abdominal structures: Secondary | ICD-10-CM | POA: Diagnosis not present

## 2019-10-19 DIAGNOSIS — I10 Essential (primary) hypertension: Secondary | ICD-10-CM | POA: Diagnosis not present

## 2019-10-19 DIAGNOSIS — E038 Other specified hypothyroidism: Secondary | ICD-10-CM | POA: Diagnosis not present

## 2019-10-19 DIAGNOSIS — Z1389 Encounter for screening for other disorder: Secondary | ICD-10-CM | POA: Diagnosis not present

## 2019-11-04 ENCOUNTER — Other Ambulatory Visit: Payer: Self-pay

## 2019-11-04 ENCOUNTER — Ambulatory Visit (AMBULATORY_SURGERY_CENTER): Payer: Self-pay

## 2019-11-04 VITALS — Temp 97.1°F | Ht 66.0 in | Wt 151.6 lb

## 2019-11-04 DIAGNOSIS — Z800 Family history of malignant neoplasm of digestive organs: Secondary | ICD-10-CM

## 2019-11-04 DIAGNOSIS — Z86010 Personal history of colonic polyps: Secondary | ICD-10-CM

## 2019-11-04 MED ORDER — NA SULFATE-K SULFATE-MG SULF 17.5-3.13-1.6 GM/177ML PO SOLN
1.0000 | Freq: Once | ORAL | 0 refills | Status: AC
Start: 2019-11-04 — End: 2019-11-04

## 2019-11-04 MED FILL — SUPREP BOWEL PREP KIT: 17.5-3.13-1 | 1 days supply | Qty: 354 | Fill #0

## 2019-11-04 NOTE — Progress Notes (Signed)
No allergies to soy or egg Pt is not on blood thinners or diet pills Denies issues with sedation/intubation Denies atrial flutter/fib Denies constipation   Emmi instructions given to pt  Pt is aware of Covid safety and care partner requirements.  

## 2019-11-22 ENCOUNTER — Encounter: Payer: Self-pay | Admitting: Gastroenterology

## 2019-11-25 ENCOUNTER — Ambulatory Visit (AMBULATORY_SURGERY_CENTER): Payer: 59 | Admitting: Gastroenterology

## 2019-11-25 ENCOUNTER — Other Ambulatory Visit: Payer: Self-pay

## 2019-11-25 VITALS — BP 104/67 | HR 47 | Temp 96.8°F | Resp 16 | Ht 66.0 in | Wt 151.0 lb

## 2019-11-25 DIAGNOSIS — D123 Benign neoplasm of transverse colon: Secondary | ICD-10-CM | POA: Diagnosis not present

## 2019-11-25 DIAGNOSIS — Z1211 Encounter for screening for malignant neoplasm of colon: Secondary | ICD-10-CM | POA: Diagnosis not present

## 2019-11-25 DIAGNOSIS — D128 Benign neoplasm of rectum: Secondary | ICD-10-CM | POA: Diagnosis not present

## 2019-11-25 DIAGNOSIS — Z86010 Personal history of colonic polyps: Secondary | ICD-10-CM

## 2019-11-25 MED ORDER — SODIUM CHLORIDE 0.9 % IV SOLN: 500.0000 mL | INTRAVENOUS | Status: AC

## 2019-11-25 NOTE — Patient Instructions (Signed)
Handouts provided on polyps and diverticulosis.  ? ?YOU HAD AN ENDOSCOPIC PROCEDURE TODAY AT THE [facility]:   Refer to the procedure report that was given to you for any specific questions about what was found during the examination.  If the procedure report does not answer your questions, please call your gastroenterologist to clarify.  If you requested that your care partner not be given the details of your procedure findings, then the procedure report has been included in a sealed envelope for you to review at your convenience later. ? ?YOU SHOULD EXPECT: Some feelings of bloating in the abdomen. Passage of more gas than usual.  Walking can help get rid of the air that was put into your GI tract during the procedure and reduce the bloating. If you had a lower endoscopy (such as a colonoscopy or flexible sigmoidoscopy) you may notice spotting of blood in your stool or on the toilet paper. If you underwent a bowel prep for your procedure, you may not have a normal bowel movement for a few days. ? ?Please Note:  You might notice some irritation and congestion in your nose or some drainage.  This is from the oxygen used during your procedure.  There is no need for concern and it should clear up in a day or so. ? ?SYMPTOMS TO REPORT IMMEDIATELY: ? ?Following lower endoscopy (colonoscopy or flexible sigmoidoscopy): ? Excessive amounts of blood in the stool ? Significant tenderness or worsening of abdominal pains ? Swelling of the abdomen that is new, acute ? Fever of 100?F or higher ? ?For urgent or emergent issues, a gastroenterologist can be reached at any hour by calling [phone]. ?Do not use MyChart messaging for urgent concerns.  ? ? ?DIET:  We do recommend a small meal at first, but then you may proceed to your regular diet.  Drink plenty of fluids but you should avoid alcoholic beverages for 24 hours. ? ?ACTIVITY:  You should plan to take it easy for the rest of today and you should NOT  DRIVE or use heavy machinery until tomorrow (because of the sedation medicines used during the test).   ? ?FOLLOW UP: ?Our staff will call the number listed on your records 48-72 hours following your procedure to check on you and address any questions or concerns that you may have regarding the information given to you following your procedure. If we do not reach you, we will leave a message.  We will attempt to reach you two times.  During this call, we will ask if you have developed any symptoms of COVID 19. If you develop any symptoms (ie: fever, flu-like symptoms, shortness of breath, cough etc.) before then, please call [phone].  If you test positive for Covid 19 in the 2 weeks post procedure, please call and report this information to us.   ? ?If any biopsies were taken you will be contacted by phone or by letter within the next 1-3 weeks.  Please call us at [phone] if you have not heard about the biopsies in 3 weeks.  ? ? ?SIGNATURES/CONFIDENTIALITY: ?You and/or your care partner have signed paperwork which will be entered into your electronic medical record.  These signatures attest to the fact that that the information above on your After Visit Summary has been reviewed and is understood.  Full responsibility of the confidentiality of this discharge information lies with you and/or your care-partner. ? ?

## 2019-11-25 NOTE — Progress Notes (Signed)
To PACU VSS. Report to [email or web address] 

## 2019-11-25 NOTE — Progress Notes (Signed)
Pt's states no medical or surgical changes since previsit or office visit.  CW vitals, EW IV and LC temp.

## 2019-11-25 NOTE — Op Note (Signed)
[facility] Patient Name: Jessica Herrera Procedure Date: 11/25/2019 11:08 AM MRN: [id number] Endoscopist: Remo Lipps P. Havery Moros , MD Age: 64 Referring MD:  Date of Birth: [birth year] Gender: Female Account #: [id number] Procedure:                Colonoscopy Indications:              High risk colon cancer surveillance: Personal                            history of colonic polyps - advanced polyp removed                            in piecemeal 2017, last colonoscopy in 2018 Medicines:                Monitored Anesthesia Care Procedure:                Pre-Anesthesia Assessment:                           - Prior to the procedure, a History and Physical                            was performed, and patient medications and                            allergies were reviewed. The patient's tolerance of                            previous anesthesia was also reviewed. The risks                            and benefits of the procedure and the sedation                            options and risks were discussed with the patient.                            All questions were answered, and informed consent                            was obtained. Prior Anticoagulants: The patient has                            taken no previous anticoagulant or antiplatelet                            agents. ASA Grade Assessment: II - A patient with                            mild systemic disease. After reviewing the risks                            and benefits, the patient was deemed in  satisfactory condition to undergo the procedure.                           After obtaining informed consent, the colonoscope                            was passed under direct vision. Throughout the                            procedure, the patient's blood pressure, pulse, and                            oxygen saturations were monitored continuously. The                            Colonoscope  was introduced through the anus and                            advanced to the the cecum, identified by                            appendiceal orifice and ileocecal valve. The                            colonoscopy was performed without difficulty. The                            patient tolerated the procedure well. The quality                            of the bowel preparation was good. The ileocecal                            valve, appendiceal orifice, and rectum were                            photographed. Scope In: 11:13:47 AM Scope Out: 11:40:07 AM Scope Withdrawal Time: 0 hours 20 minutes 54 seconds  Total Procedure Duration: 0 hours 26 minutes 20 seconds  Findings:                 The perianal and digital rectal examinations were                            normal.                           Three sessile polyps were found in the transverse                            colon. The polyps were 3 to 5 mm in size. These                            polyps were removed with a cold snare. Resection  and retrieval were complete.                           A 4 mm polyp was found in the rectum. The polyp was                            sessile. The polyp was removed with a cold snare.                            Resection and retrieval were complete.                           Many medium-mouthed diverticula were found in the                            sigmoid colon.                           The exam was otherwise without abnormality. Complications:            No immediate complications. Estimated blood loss:                            Minimal. Estimated Blood Loss:     Estimated blood loss was minimal. Impression:               - Three 3 to 5 mm polyps in the transverse colon,                            removed with a cold snare. Resected and retrieved.                           - One 4 mm polyp in the rectum, removed with a cold                            snare.  Resected and retrieved.                           - Diverticulosis in the sigmoid colon.                           - The examination was otherwise normal. Recommendation:           - Patient has a contact number available for                            emergencies. The signs and symptoms of potential                            delayed complications were discussed with the                            patient. Return to normal activities tomorrow.  Written discharge instructions were provided to the                            patient.                           - Resume previous diet.                           - Continue present medications.                           - Await pathology results. Remo Lipps P. [name], MD 11/25/2019 11:44:22 AM This report has been signed electronically.

## 2019-11-25 NOTE — Progress Notes (Signed)
Called to room to assist during endoscopic procedure.  Patient ID and intended procedure confirmed with present staff. Received instructions for my participation in the procedure from the performing physician.  

## 2019-11-29 ENCOUNTER — Telehealth: Payer: Self-pay

## 2019-11-29 NOTE — Telephone Encounter (Signed)
LVM

## 2019-11-29 NOTE — Telephone Encounter (Signed)
First post procedure follow up call, no answer 

## 2019-12-19 MED FILL — LOSARTAN POTASSIUM 25 MG TA: 25 | 90 days supply | Qty: 90 | Fill #2

## 2019-12-21 ENCOUNTER — Encounter (INDEPENDENT_AMBULATORY_CARE_PROVIDER_SITE_OTHER): Payer: 59 | Admitting: Ophthalmology

## 2020-01-16 MED FILL — SYNTHROID 100 MCG TABLET: 100 | 90 days supply | Qty: 90 | Fill #3

## 2020-01-16 MED FILL — DUREZOL 0.05% EYE DROPS: 0.05 | 50 days supply | Qty: 5 | Fill #1

## 2020-02-21 MED FILL — RESTASIS 0.05% EYE EMULSION: 0.05 | 90 days supply | Qty: 180 | Fill #0

## 2020-03-08 MED FILL — ATORVASTATIN CALCIUM 10 MG: 10 | 90 days supply | Qty: 90 | Fill #3

## 2020-03-12 DIAGNOSIS — R0981 Nasal congestion: Secondary | ICD-10-CM | POA: Diagnosis not present

## 2020-03-12 DIAGNOSIS — U071 COVID-19: Secondary | ICD-10-CM | POA: Diagnosis not present

## 2020-03-20 MED FILL — LOSARTAN POTASSIUM 25 MG TA: 25 | 90 days supply | Qty: 90 | Fill #3

## 2020-04-05 DIAGNOSIS — D1803 Hemangioma of intra-abdominal structures: Secondary | ICD-10-CM | POA: Diagnosis not present

## 2020-04-05 DIAGNOSIS — I10 Essential (primary) hypertension: Secondary | ICD-10-CM | POA: Diagnosis not present

## 2020-04-05 DIAGNOSIS — D649 Anemia, unspecified: Secondary | ICD-10-CM | POA: Diagnosis not present

## 2020-04-05 DIAGNOSIS — K635 Polyp of colon: Secondary | ICD-10-CM | POA: Diagnosis not present

## 2020-04-05 DIAGNOSIS — E538 Deficiency of other specified B group vitamins: Secondary | ICD-10-CM | POA: Diagnosis not present

## 2020-04-05 DIAGNOSIS — E039 Hypothyroidism, unspecified: Secondary | ICD-10-CM | POA: Diagnosis not present

## 2020-04-17 ENCOUNTER — Other Ambulatory Visit (HOSPITAL_COMMUNITY): Payer: Self-pay | Admitting: Endocrinology

## 2020-04-17 DIAGNOSIS — Z01419 Encounter for gynecological examination (general) (routine) without abnormal findings: Secondary | ICD-10-CM | POA: Diagnosis not present

## 2020-04-17 DIAGNOSIS — Z13820 Encounter for screening for osteoporosis: Secondary | ICD-10-CM | POA: Diagnosis not present

## 2020-04-17 DIAGNOSIS — Z6825 Body mass index (BMI) 25.0-25.9, adult: Secondary | ICD-10-CM | POA: Diagnosis not present

## 2020-04-17 MED FILL — SYNTHROID 100 MCG TABLET: 100 | 90 days supply | Qty: 90 | Fill #0

## 2020-04-18 DIAGNOSIS — Z01419 Encounter for gynecological examination (general) (routine) without abnormal findings: Secondary | ICD-10-CM | POA: Diagnosis not present

## 2020-04-19 ENCOUNTER — Other Ambulatory Visit: Payer: Self-pay | Admitting: Obstetrics and Gynecology

## 2020-04-19 DIAGNOSIS — Z9189 Other specified personal risk factors, not elsewhere classified: Secondary | ICD-10-CM

## 2020-05-09 DIAGNOSIS — Z23 Encounter for immunization: Secondary | ICD-10-CM | POA: Diagnosis not present

## 2020-05-21 ENCOUNTER — Other Ambulatory Visit: Payer: 59

## 2020-06-05 ENCOUNTER — Other Ambulatory Visit (HOSPITAL_COMMUNITY): Payer: Self-pay | Admitting: Endocrinology

## 2020-06-05 MED FILL — ATORVASTATIN CALCIUM 10 MG: 10 | 90 days supply | Qty: 90 | Fill #0

## 2020-06-18 ENCOUNTER — Other Ambulatory Visit (HOSPITAL_COMMUNITY): Payer: Self-pay | Admitting: Endocrinology

## 2020-06-18 MED FILL — LOSARTAN POTASSIUM 25 MG TA: 25 | 90 days supply | Qty: 90 | Fill #0

## 2020-06-19 MED FILL — DUREZOL 0.05% EYE DROPS: 0.05 | 38 days supply | Qty: 5 | Fill #2

## 2020-06-20 ENCOUNTER — Encounter (INDEPENDENT_AMBULATORY_CARE_PROVIDER_SITE_OTHER): Payer: Self-pay | Admitting: Ophthalmology

## 2020-06-20 ENCOUNTER — Other Ambulatory Visit: Payer: Self-pay

## 2020-06-20 DIAGNOSIS — H35372 Puckering of macula, left eye: Secondary | ICD-10-CM

## 2020-06-29 ENCOUNTER — Ambulatory Visit: Payer: 59 | Attending: Internal Medicine

## 2020-06-29 DIAGNOSIS — Z23 Encounter for immunization: Secondary | ICD-10-CM

## 2020-06-29 NOTE — Progress Notes (Signed)
° °  Covid-19 Vaccination Clinic  Name:  Jessica Herrera    MRN: [id number] DOB: [birth year]  06/29/2020  Jessica Herrera was observed post Covid-19 immunization for 15 mins without incident. She was provided with Vaccine Information Sheet and instruction to access the V-Safe system.   Jessica Herrera was instructed to call 911 with any severe reactions post vaccine:  Difficulty breathing   Swelling of face and throat   A fast heartbeat   A bad rash all over body   Dizziness and weakness   Immunizations Administered    No immunizations on file.

## 2020-07-10 DIAGNOSIS — L820 Inflamed seborrheic keratosis: Secondary | ICD-10-CM | POA: Diagnosis not present

## 2020-07-10 DIAGNOSIS — D225 Melanocytic nevi of trunk: Secondary | ICD-10-CM | POA: Diagnosis not present

## 2020-07-10 DIAGNOSIS — L723 Sebaceous cyst: Secondary | ICD-10-CM | POA: Diagnosis not present

## 2020-07-10 DIAGNOSIS — L821 Other seborrheic keratosis: Secondary | ICD-10-CM | POA: Diagnosis not present

## 2020-07-10 DIAGNOSIS — L570 Actinic keratosis: Secondary | ICD-10-CM | POA: Diagnosis not present

## 2020-07-10 DIAGNOSIS — D1801 Hemangioma of skin and subcutaneous tissue: Secondary | ICD-10-CM | POA: Diagnosis not present

## 2020-07-16 MED FILL — SYNTHROID 100 MCG TABLET: 100 | 90 days supply | Qty: 90 | Fill #1

## 2020-08-20 ENCOUNTER — Other Ambulatory Visit (HOSPITAL_COMMUNITY): Payer: Self-pay | Admitting: Ophthalmology

## 2020-08-20 MED FILL — RESTASIS 0.05% EYE EMULSION: 0.05 | 30 days supply | Qty: 60 | Fill #0

## 2020-08-21 MED FILL — DUREZOL 0.05% EYE DROPS: 0.05 | 38 days supply | Qty: 5 | Fill #3

## 2020-08-22 ENCOUNTER — Ambulatory Visit
Admission: RE | Admit: 2020-08-22 | Discharge: 2020-08-22 | Disposition: A | Discharge status: Home / Self Care | Payer: 59 | Source: Ambulatory Visit | Attending: Obstetrics and Gynecology | Admitting: Obstetrics and Gynecology

## 2020-08-22 DIAGNOSIS — Z9189 Other specified personal risk factors, not elsewhere classified: Secondary | ICD-10-CM

## 2020-08-22 DIAGNOSIS — N6489 Other specified disorders of breast: Secondary | ICD-10-CM | POA: Diagnosis not present

## 2020-08-22 DIAGNOSIS — Z803 Family history of malignant neoplasm of breast: Secondary | ICD-10-CM | POA: Diagnosis not present

## 2020-08-22 MED ORDER — GADOBUTROL 1 MMOL/ML IV SOLN
7.0000 mL | Freq: Once | INTRAVENOUS | Status: AC | PRN
  Administered 2020-08-22: 7 mL via INTRAVENOUS

## 2020-08-24 ENCOUNTER — Telehealth: Payer: Self-pay | Admitting: Cardiothoracic Surgery

## 2020-08-24 ENCOUNTER — Other Ambulatory Visit: Payer: Self-pay | Admitting: Cardiothoracic Surgery

## 2020-08-24 DIAGNOSIS — R9389 Abnormal findings on diagnostic imaging of other specified body structures: Secondary | ICD-10-CM

## 2020-08-24 NOTE — Telephone Encounter (Signed)
      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  She is concerned about the finding on the MRI called and asked me to review her chart and radiographs and offer advice. Patient recommended a noncontrasted CT scan of the chest to get better detailed views of the sternum.  We will make arrangements through the office to obtain the scan and see her in follow-up next.

## 2020-08-27 ENCOUNTER — Other Ambulatory Visit: Payer: Self-pay

## 2020-08-27 ENCOUNTER — Ambulatory Visit (HOSPITAL_COMMUNITY)
Admission: RE | Admit: 2020-08-27 | Discharge: 2020-08-27 | Disposition: A | Discharge status: Home / Self Care | Payer: 59 | Source: Ambulatory Visit | Attending: Cardiothoracic Surgery | Admitting: Cardiothoracic Surgery

## 2020-08-27 DIAGNOSIS — I700 Atherosclerosis of aorta: Secondary | ICD-10-CM | POA: Diagnosis not present

## 2020-08-27 DIAGNOSIS — R9389 Abnormal findings on diagnostic imaging of other specified body structures: Secondary | ICD-10-CM | POA: Insufficient documentation

## 2020-08-27 DIAGNOSIS — M47814 Spondylosis without myelopathy or radiculopathy, thoracic region: Secondary | ICD-10-CM | POA: Diagnosis not present

## 2020-08-27 DIAGNOSIS — D1809 Hemangioma of other sites: Secondary | ICD-10-CM | POA: Diagnosis not present

## 2020-08-27 DIAGNOSIS — J8410 Pulmonary fibrosis, unspecified: Secondary | ICD-10-CM | POA: Diagnosis not present

## 2020-08-28 ENCOUNTER — Other Ambulatory Visit (HOSPITAL_COMMUNITY): Payer: Self-pay | Admitting: Internal Medicine

## 2020-08-28 ENCOUNTER — Other Ambulatory Visit: Payer: Self-pay | Admitting: Obstetrics and Gynecology

## 2020-08-28 ENCOUNTER — Ambulatory Visit: Payer: 59 | Admitting: Cardiothoracic Surgery

## 2020-08-28 VITALS — BP 115/79 | HR 58 | Resp 20 | Ht 66.0 in | Wt 150.0 lb

## 2020-08-28 DIAGNOSIS — R9389 Abnormal findings on diagnostic imaging of other specified body structures: Secondary | ICD-10-CM

## 2020-08-28 NOTE — Progress Notes (Signed)
[address]       [place] [address]             [phone]                    Jessica Herrera [place] Medical Record [id number] Date of Birth: 10-08-1955  Referring: Dr. Merrily Brittle  primary Care: Reynold Bowen, MD Primary Cardiologist: No primary care provider on file.  Chief Complaint:    Chief Complaint  Patient presents with  . Consult    F/u after Chest CT 08/27/20 for incidental 1 cm enhancing lesion of the lower sternum     History of Present Illness:    Jessica Herrera 65 y.o. female is seen in the office  today for evaluation of findings on a recent breast MRI.  The patient gives a history of 10 to 12 years ago of having a left breast biopsy with some dysplasia-she has had no diagnosis of breast cancer in the past.  She recently had a breast MRI scan done-on the scan there was an abnormality of unknown significance noted by radiology.  The patient was concerned about this and came to be checked, we also obtained a CT of the chest to look at the sternum specifically.  She has no history of any underlying malignancy.  She denies any pain discomfort swelling or deformity of the sternum.       Current Activity/ Functional Status:  Patient is independent with mobility/ambulation, transfers, ADL's, IADL's.   Zubrod Score: At the time of surgery this patient's most appropriate activity status/level should be described as: [x]     0    Normal activity, no symptoms []     1    Restricted in physical strenuous activity but ambulatory, able to do out light work []     2    Ambulatory and capable of self care, unable to do work activities, up and about               >50 % of waking hours                              []     3    Only limited self care, in bed greater than 50% of waking hours []     4    Completely disabled, no self care, confined to bed or chair []     5    Moribund   Past Medical History:  Diagnosis Date  . Arthritis     wrists  . Cataracts, bilateral    bilateral repair  . GERD (gastroesophageal reflux disease)    self report  . Hyperlipidemia   . Hypertension   . Night sweats   . Palpitations   . Thyroid disease 1976    Past Surgical History:  Procedure Laterality Date  . BREAST BIOPSY  2012   left  . BREAST EXCISIONAL BIOPSY Left 07/2010  . CATARACT EXTRACTION    . CESAREAN SECTION    . COLONOSCOPY     2018  . detached retina repair  2014  . FOOT SURGERY     right  . LAPAROSCOPIC OOPHERECTOMY Left     Family History  Problem Relation Age of Onset  . Cancer Mother        uterine  . Breast cancer Mother   . Other Father  heart bypass   . Heart disease Father   . Colon cancer Father        pt undergoing questionable colon cancer workup at time of death  . Colon polyps Neg Hx   . Esophageal cancer Neg Hx   . Rectal cancer Neg Hx      Social History   Tobacco Use  Smoking Status Never Smoker  Smokeless Tobacco Never Used    Social History   Substance and Sexual Activity  Alcohol Use Yes  . Alcohol/week: 5.0 - 7.0 standard drinks  . Types: 5 - 7 Glasses of wine per week     No Known Allergies  Current Outpatient Medications  Medication Sig Dispense Refill  . atorvastatin (LIPITOR) 10 MG tablet Take 10 mg by mouth daily.    . Bromfenac Sodium (PROLENSA) 0.07 % SOLN Prolensa 0.07 % eye drops  PLACE 1 DROP IN AFFECTED EYE TWICE A DAY    . calcium carbonate (TUMS - DOSED IN MG ELEMENTAL CALCIUM) 500 MG chewable tablet Chew 1 tablet by mouth daily. Three daily    . Cholecalciferol (VITAMIN D-3 PO) Take by mouth.    . cycloSPORINE (RESTASIS) 0.05 % ophthalmic emulsion 1 drop 2 (two) times daily.    . DUREZOL 0.05 % EMUL     . ibuprofen (ADVIL,MOTRIN) 200 MG tablet Take 200 mg by mouth every 6 (six) hours as needed for pain.    Marland Kitchen losartan (COZAAR) 25 MG tablet Take 25 mg by mouth daily.    Marland Kitchen SYNTHROID 100 MCG tablet 1 mcg Daily.     Current Facility-Administered  Medications  Medication Dose Route Frequency Provider Last Rate Last Admin  . 0.9 %  sodium chloride infusion  500 mL Intravenous Continuous Armbruster, Carlota Raspberry, MD      . 0.9 %  sodium chloride infusion  500 mL Intravenous Continuous Armbruster, Carlota Raspberry, MD          Review of Systems:     Cardiac Review of Systems: [Y] = yes  or   [ N ] = no   Chest Pain [  n  ]  Resting SOB [ n  ] Exertional SOB  [n  ]  Orthopnea [ n ]   Pedal Edema [  n ]    Palpitations [ n ] Syncope  [  n]   Presyncope [   ] n  General Review of Systems: [Y] = yes [  ]=no Constitional: recent weight change [  ];  Wt loss over the last 3 months [   ] anorexia [  ]; fatigue [  ]; nausea [  ]; night sweats [  ]; fever [  ]; or chills [  ];          Eye : blurred vision [  ]; diplopia [   ]; vision changes [  ];  Amaurosis fugax[  ]; Resp: cough [  ];  wheezing[  ];  hemoptysis[  ]; shortness of breath[  ]; paroxysmal nocturnal dyspnea[  ]; dyspnea on exertion[  ]; or orthopnea[  ];  GI:  gallstones[  ], vomiting[  ];  dysphagia[  ]; melena[  ];  hematochezia [  ]; heartburn[  ];   Hx of  Colonoscopy[  ]; GU: kidney stones [  ]; hematuria[  ];   dysuria [  ];  nocturia[  ];  history of     obstruction [  ]; urinary frequency [  ]  Skin: rash, swelling[  ];, hair loss[  ];  peripheral edema[  ];  or itching[  ]; Musculosketetal: myalgias[  ];  joint swelling[  ];  joint erythema[  ];  joint pain[  ];  back pain[  ];  Heme/Lymph: bruising[  ];  bleeding[  ];  anemia[  ];  Neuro: TIA[  ];  headaches[  ];  stroke[  ];  vertigo[  ];  seizures[  ];   paresthesias[  ];  difficulty walking[  ];  Psych:depression[  ]; anxiety[  ];  Endocrine: diabetes[  ];  thyroid dysfunction[  ];  Immunizations: Flu up to date [ y ]; Pneumococcal up to date [  ]; COVID-70 vacination completed  [x3    ]  Other:     PHYSICAL EXAMINATION: BP 115/79   Pulse (!) 58   Resp 20   Ht 5\' 6"  (1.676 m)   Wt 150 lb (68 kg)   SpO2 96%  Comment: RA  BMI 24.21 kg/m  General appearance: alert, cooperative, appears stated age and no distress Physical exam was specifically limited to the sternum she had no swelling deformity or tenderness on examination of the sternum or along the costochondral junctions  Diagnostic Studies & Laboratory data:     Recent Radiology Findings:   CT CHEST WO CONTRAST  Result Date: 08/28/2020 CLINICAL DATA:  Indeterminate enhancing lesion in the lower sternum on recent breast MRI. History of ALH. No personal history of breast cancer. EXAM: CT CHEST WITHOUT CONTRAST TECHNIQUE: Multidetector CT imaging of the chest was performed following the standard protocol without IV contrast. COMPARISON:  08/22/2020 breast MRI.  07/04/2019 CT abdomen/pelvis. FINDINGS: Cardiovascular: Normal heart size. No significant pericardial effusion/thickening. Mildly atherosclerotic nonaneurysmal thoracic aorta. Normal caliber pulmonary arteries. Mediastinum/Nodes: No discrete thyroid nodules. Unremarkable esophagus. No pathologically enlarged axillary, mediastinal or hilar lymph nodes, noting limited sensitivity for the detection of hilar adenopathy on this noncontrast study. Lungs/Pleura: No pneumothorax. No pleural effusion. No acute consolidative airspace disease, lung masses or significant pulmonary nodules. Tiny calcified 2 mm medial right middle lobe granuloma. Tiny parenchymal band at the left lung base compatible with nonspecific minimal postinfectious/postinflammatory scarring. Upper abdomen: Hypodense 5.5 x 3.7 cm segment 2 left liver mass (series 2/image 136), stable since 07/04/2019 CT abdomen/pelvis study, where it was seen to represent a hemangioma. Two additional scattered subcentimeter hypodense lesions are too small to characterize and are unchanged, considered benign. Musculoskeletal: No aggressive appearing focal osseous lesions. No evidence of a sternal lesion on CT. The 1 cm enhancing sternal lesion visualized on  08/22/2020 breast MRI is occult on chest CT. T11 vertebral hemangioma. Mild thoracic spondylosis. IMPRESSION: 1. No evidence of a sternal lesion on CT. The 1 cm enhancing sternal lesion visualized on 08/22/2020 breast MRI is occult on chest CT. Any further need for imaging evaluation such as a bone scan or sternal MRI without and with IV contrast should be based on clinical assessment. 2. No active pulmonary disease.  No thoracic adenopathy. 3. Stable 5.5 cm left liver hemangioma. 4. Aortic Atherosclerosis (ICD10-I70.0). Electronically Signed   By: Ilona Sorrel M.D.   On: 08/28/2020 08:32   MR BREAST BILATERAL W WO CONTRAST INC CAD  Result Date: 08/23/2020 CLINICAL DATA:  High risk screening. Patient with history of left breast excision in 2009 for [facility]. Family history of breast cancer. LABS:  None EXAM: BILATERAL BREAST MRI WITH AND WITHOUT CONTRAST TECHNIQUE: Multiplanar, multisequence MR images of both breasts were obtained prior to and  following the intravenous administration of 7 ml of Gadavist Three-dimensional MR images were rendered by post-processing of the original MR data on an independent workstation. The three-dimensional MR images were interpreted, and findings are reported in the following complete MRI report for this study. Three dimensional images were evaluated at the independent interpreting workstation using the DynaCAD thin client. COMPARISON:  None. FINDINGS: Breast composition: b. Scattered fibroglandular tissue. Background parenchymal enhancement: Minimal. Right breast: No mass or abnormal enhancement. Left breast: No mass or abnormal enhancement. Lymph nodes: No abnormal appearing lymph nodes. Ancillary findings: Multiple nonenhancing T2 hyperintense lesions in the liver, previously characterized as benign hemangiomas and cysts. There is a subtle small T2 bright enhancing lesion in the lower sternum measuring 1.0 cm (series 8, image 97). IMPRESSION: 1. No MRI evidence of malignancy in  either breast. 2. Indeterminate small T2 bright enhancing lesion in the lower sternum. RECOMMENDATION: 1. CT chest without contrast for further evaluation of the indeterminate small sternal lesion. 2.  Annual screening mammography which will be due in March 2022. 3. Continued annual high risk screening MRI if patient is at elevated lifetime risk (greater than 20%). BI-RADS CATEGORY  1: Negative. Electronically Signed   By: Audie Pinto M.D.   On: 08/23/2020 08:46     I have independently reviewed the above radiology studies  and reviewed the findings with the patient.   Recent Lab Findings: Lab Results  Component Value Date   WBC 7.8 08/14/2010   HGB 14.3 08/14/2010   HCT 42.6 08/14/2010   PLT 201 08/14/2010   GLUCOSE 105 (H) 08/14/2010   ALT 42 (H) 08/28/2016   AST 23 08/28/2016   NA 140 08/14/2010   K 4.3 08/14/2010   CL 104 08/14/2010   CREATININE 0.84 08/14/2010   BUN 15 08/14/2010   CO2 26 08/14/2010      Assessment / Plan:   #1 incidental MRI finding of questionable significance in the sternum while having a routine MRI of the breast.-Subsequent CT of the chest including the sternum shows no evidence of the sternal lesion-on physical exam there is no correlation either at this point further evaluation is not warranted  #2 stable hemangioma of the liver #3 very mild aortic atherosclerosis on CT-although the CT not has not specifically dedicated to coronary calcium score there is not appear to be significant coronary calcification.  I reviewed the films with the patient and her husband in detail and do not recommend any further evaluation of her sternum at this time.   Grace Isaac MD      [address] [place] [address] Office [phone]     08/28/2020 5:48 PM

## 2020-08-31 MED FILL — SHINGRIX 50 MCG SUS: 50 | 1 days supply | Qty: 1 | Fill #0

## 2020-09-04 MED FILL — ATORVASTATIN CALCIUM 10 MG: 10 | 90 days supply | Qty: 90 | Fill #1

## 2020-09-12 ENCOUNTER — Other Ambulatory Visit: Payer: Self-pay | Admitting: Obstetrics and Gynecology

## 2020-09-12 DIAGNOSIS — D167 Benign neoplasm of ribs, sternum and clavicle: Secondary | ICD-10-CM

## 2020-09-17 ENCOUNTER — Other Ambulatory Visit (HOSPITAL_COMMUNITY): Payer: Self-pay | Admitting: Ophthalmology

## 2020-09-17 DIAGNOSIS — H2512 Age-related nuclear cataract, left eye: Secondary | ICD-10-CM | POA: Diagnosis not present

## 2020-09-17 DIAGNOSIS — Z9889 Other specified postprocedural states: Secondary | ICD-10-CM | POA: Diagnosis not present

## 2020-09-17 DIAGNOSIS — H16223 Keratoconjunctivitis sicca, not specified as Sjogren's, bilateral: Secondary | ICD-10-CM | POA: Diagnosis not present

## 2020-09-17 DIAGNOSIS — E079 Disorder of thyroid, unspecified: Secondary | ICD-10-CM | POA: Diagnosis not present

## 2020-09-19 MED FILL — LOSARTAN POTASSIUM 25 MG TA: 25 | 30 days supply | Qty: 30 | Fill #1

## 2020-09-20 DIAGNOSIS — M13841 Other specified arthritis, right hand: Secondary | ICD-10-CM | POA: Diagnosis not present

## 2020-09-20 DIAGNOSIS — M79644 Pain in right finger(s): Secondary | ICD-10-CM | POA: Diagnosis not present

## 2020-09-27 MED FILL — CYCLOSPORINE 0.05 % EMUL: 0.05 | 90 days supply | Qty: 180 | Fill #0

## 2020-10-03 DIAGNOSIS — I700 Atherosclerosis of aorta: Secondary | ICD-10-CM | POA: Diagnosis not present

## 2020-10-03 DIAGNOSIS — E039 Hypothyroidism, unspecified: Secondary | ICD-10-CM | POA: Diagnosis not present

## 2020-10-03 DIAGNOSIS — D1803 Hemangioma of intra-abdominal structures: Secondary | ICD-10-CM | POA: Diagnosis not present

## 2020-10-03 DIAGNOSIS — E785 Hyperlipidemia, unspecified: Secondary | ICD-10-CM | POA: Diagnosis not present

## 2020-10-03 DIAGNOSIS — M79644 Pain in right finger(s): Secondary | ICD-10-CM | POA: Diagnosis not present

## 2020-10-03 DIAGNOSIS — E538 Deficiency of other specified B group vitamins: Secondary | ICD-10-CM | POA: Diagnosis not present

## 2020-10-03 DIAGNOSIS — K635 Polyp of colon: Secondary | ICD-10-CM | POA: Diagnosis not present

## 2020-10-03 DIAGNOSIS — I38 Endocarditis, valve unspecified: Secondary | ICD-10-CM | POA: Diagnosis not present

## 2020-10-03 DIAGNOSIS — I10 Essential (primary) hypertension: Secondary | ICD-10-CM | POA: Diagnosis not present

## 2020-10-03 DIAGNOSIS — D649 Anemia, unspecified: Secondary | ICD-10-CM | POA: Diagnosis not present

## 2020-10-15 MED FILL — LOSARTAN POTASSIUM 25 MG TA: 25 | 30 days supply | Qty: 30 | Fill #2

## 2020-10-15 MED FILL — SYNTHROID 100 MCG TABLET: 100 | 90 days supply | Qty: 90 | Fill #2

## 2020-11-15 ENCOUNTER — Other Ambulatory Visit (HOSPITAL_COMMUNITY): Payer: Self-pay

## 2020-11-16 ENCOUNTER — Other Ambulatory Visit (HOSPITAL_COMMUNITY): Payer: Self-pay

## 2020-11-20 ENCOUNTER — Other Ambulatory Visit (HOSPITAL_COMMUNITY): Payer: Self-pay

## 2020-11-20 MED FILL — Zoster Vac Recombinant Adjuvanted for IM Inj 50 MCG/0.5ML: INTRAMUSCULAR | 1 days supply | Qty: 1 | Fill #0 | Status: AC

## 2020-11-20 MED FILL — Losartan Potassium Tab 25 MG: ORAL | 90 days supply | Qty: 90 | Fill #0 | Status: AC

## 2020-11-22 ENCOUNTER — Other Ambulatory Visit: Payer: Self-pay | Admitting: Obstetrics and Gynecology

## 2020-11-22 DIAGNOSIS — Z1231 Encounter for screening mammogram for malignant neoplasm of breast: Secondary | ICD-10-CM

## 2020-12-03 ENCOUNTER — Other Ambulatory Visit (HOSPITAL_COMMUNITY): Payer: Self-pay

## 2020-12-03 MED FILL — Atorvastatin Calcium Tab 10 MG (Base Equivalent): ORAL | 90 days supply | Qty: 90 | Fill #0 | Status: AC

## 2020-12-04 ENCOUNTER — Other Ambulatory Visit (HOSPITAL_COMMUNITY): Payer: Self-pay

## 2020-12-05 ENCOUNTER — Other Ambulatory Visit (HOSPITAL_COMMUNITY): Payer: Self-pay

## 2020-12-06 ENCOUNTER — Other Ambulatory Visit (HOSPITAL_COMMUNITY): Payer: Self-pay

## 2020-12-06 MED ORDER — DIFLUPREDNATE 0.05 % OP EMUL
1.0000 [drp] | Freq: Four times a day (QID) | OPHTHALMIC | 99 refills | Status: DC
  Filled 2020-12-06 – 2020-12-12 (×2): qty 5, 25d supply, fill #0
  Filled 2020-12-12 – 2021-03-25 (×2): qty 5, 25d supply, fill #1

## 2020-12-12 ENCOUNTER — Other Ambulatory Visit (HOSPITAL_COMMUNITY): Payer: Self-pay

## 2020-12-13 ENCOUNTER — Other Ambulatory Visit (HOSPITAL_COMMUNITY): Payer: Self-pay

## 2020-12-25 ENCOUNTER — Other Ambulatory Visit (HOSPITAL_COMMUNITY): Payer: Self-pay

## 2020-12-25 DIAGNOSIS — R059 Cough, unspecified: Secondary | ICD-10-CM | POA: Diagnosis not present

## 2020-12-25 DIAGNOSIS — Z1152 Encounter for screening for COVID-19: Secondary | ICD-10-CM | POA: Diagnosis not present

## 2020-12-25 DIAGNOSIS — J069 Acute upper respiratory infection, unspecified: Secondary | ICD-10-CM | POA: Diagnosis not present

## 2020-12-25 MED ORDER — HYDROCODONE BIT-HOMATROP MBR 5-1.5 MG/5ML PO SOLN
5.0000 mL | Freq: Four times a day (QID) | ORAL | 0 refills | Status: DC | PRN
Start: 2020-12-25 — End: 2021-04-23
  Filled 2020-12-25: qty 140, 7d supply, fill #0

## 2020-12-25 MED ORDER — PREDNISONE 10 MG PO TABS
10.0000 mg | ORAL_TABLET | Freq: Two times a day (BID) | ORAL | 0 refills | Status: DC
  Filled 2020-12-25: qty 10, 5d supply, fill #0

## 2021-01-14 ENCOUNTER — Other Ambulatory Visit: Payer: Self-pay

## 2021-01-14 ENCOUNTER — Ambulatory Visit
Admission: RE | Admit: 2021-01-14 | Discharge: 2021-01-14 | Disposition: A | Discharge status: Home / Self Care | Payer: 59 | Source: Ambulatory Visit | Attending: Obstetrics and Gynecology | Admitting: Obstetrics and Gynecology

## 2021-01-14 ENCOUNTER — Other Ambulatory Visit (HOSPITAL_COMMUNITY): Payer: Self-pay

## 2021-01-14 DIAGNOSIS — Z1231 Encounter for screening mammogram for malignant neoplasm of breast: Secondary | ICD-10-CM

## 2021-01-14 DIAGNOSIS — Z803 Family history of malignant neoplasm of breast: Secondary | ICD-10-CM | POA: Diagnosis not present

## 2021-01-14 MED FILL — Levothyroxine Sodium Tab 100 MCG: ORAL | 90 days supply | Qty: 90 | Fill #0 | Status: AC

## 2021-02-14 ENCOUNTER — Other Ambulatory Visit (HOSPITAL_COMMUNITY): Payer: Self-pay

## 2021-02-14 MED FILL — Losartan Potassium Tab 25 MG: ORAL | 90 days supply | Qty: 90 | Fill #1 | Status: AC

## 2021-03-06 ENCOUNTER — Other Ambulatory Visit (HOSPITAL_COMMUNITY): Payer: Self-pay

## 2021-03-06 MED FILL — Atorvastatin Calcium Tab 10 MG (Base Equivalent): ORAL | 90 days supply | Qty: 90 | Fill #1 | Status: AC

## 2021-03-09 ENCOUNTER — Encounter (HOSPITAL_BASED_OUTPATIENT_CLINIC_OR_DEPARTMENT_OTHER): Payer: Self-pay

## 2021-03-09 ENCOUNTER — Emergency Department (HOSPITAL_BASED_OUTPATIENT_CLINIC_OR_DEPARTMENT_OTHER): Payer: 59

## 2021-03-09 ENCOUNTER — Emergency Department (HOSPITAL_BASED_OUTPATIENT_CLINIC_OR_DEPARTMENT_OTHER)
Admission: EM | Admit: 2021-03-09 | Discharge: 2021-03-09 | Disposition: A | Discharge status: Home / Self Care | Payer: 59 | Attending: Emergency Medicine | Admitting: Emergency Medicine

## 2021-03-09 ENCOUNTER — Other Ambulatory Visit: Payer: Self-pay

## 2021-03-09 DIAGNOSIS — R42 Dizziness and giddiness: Secondary | ICD-10-CM | POA: Diagnosis not present

## 2021-03-09 DIAGNOSIS — R001 Bradycardia, unspecified: Secondary | ICD-10-CM | POA: Insufficient documentation

## 2021-03-09 DIAGNOSIS — R002 Palpitations: Secondary | ICD-10-CM | POA: Diagnosis not present

## 2021-03-09 DIAGNOSIS — K219 Gastro-esophageal reflux disease without esophagitis: Secondary | ICD-10-CM | POA: Diagnosis not present

## 2021-03-09 DIAGNOSIS — E039 Hypothyroidism, unspecified: Secondary | ICD-10-CM | POA: Diagnosis not present

## 2021-03-09 DIAGNOSIS — I10 Essential (primary) hypertension: Secondary | ICD-10-CM | POA: Insufficient documentation

## 2021-03-09 DIAGNOSIS — Z79899 Other long term (current) drug therapy: Secondary | ICD-10-CM | POA: Diagnosis not present

## 2021-03-09 DIAGNOSIS — R079 Chest pain, unspecified: Secondary | ICD-10-CM

## 2021-03-09 LAB — BASIC METABOLIC PANEL
Anion gap: 9 (ref 5–15)
BUN: 23 mg/dL (ref 8–23)
CO2: 26 mmol/L (ref 22–32)
Calcium: 9.1 mg/dL (ref 8.9–10.3)
Chloride: 105 mmol/L (ref 98–111)
Creatinine, Ser: 0.79 mg/dL (ref 0.44–1.00)
GFR, Estimated: >60 mL/min (ref >60)
Glucose, Bld: 100 mg/dL — ABNORMAL HIGH (ref 70–99)
Potassium: 3.9 mmol/L (ref 3.5–5.1)
Sodium: 140 mmol/L (ref 135–145)

## 2021-03-09 LAB — CBC
HCT: 42.6 % (ref 36.0–46.0)
Hemoglobin: 14.2 g/dL (ref 12.0–15.0)
MCH: 32.5 pg (ref 26.0–34.0)
MCHC: 33.3 g/dL (ref 30.0–36.0)
MCV: 97.5 fL (ref 80.0–100.0)
Platelets: 176 10*3/uL (ref 150–400)
RBC: 4.37 MIL/uL (ref 3.87–5.11)
RDW: 14 % (ref 11.5–15.5)
WBC: 7.9 10*3/uL (ref 4.0–10.5)
nRBC: 0 % (ref 0.0–0.2)

## 2021-03-09 LAB — TROPONIN I (HIGH SENSITIVITY)
Troponin I (High Sensitivity): 4 ng/L (ref <18)
Troponin I (High Sensitivity): 4 ng/L (ref <18)

## 2021-03-09 NOTE — ED Provider Notes (Signed)
[facility] EMERGENCY DEPT Provider Note   CSN: [id number] Arrival date & time: 03/09/21  [id number]     History Chief Complaint  Patient presents with   Dizziness   Irregular Heart Beat   Palpitations    Jessica Herrera is a 65 y.o. female.  Patient presents with chief complaint of lightheadedness dizziness intermittent since yesterday.  She states symptoms have since resolved but she had them throughout the day yesterday and most of the day.  Denies any episode of headache or chest pain.  Denies any difficulty breathing denies fall or trauma.  Her husband states that he checked her heart rate few hours prior to arrival in the ER and noted that it was around 90 bpm and seemed irregular at the time.  No reports of fevers or cough or vomiting or diarrhea or shortness of breath.      Past Medical History:  Diagnosis Date   Arthritis    wrists   Cataracts, bilateral    bilateral repair   GERD (gastroesophageal reflux disease)    self report   Hyperlipidemia    Hypertension    Night sweats    Palpitations    Thyroid disease 1976    Patient Active Problem List   Diagnosis Date Noted   LLQ abdominal pain 02/18/2018   Achilles tendinitis, left leg 03/31/2017   Metatarsal stress fracture, right, initial encounter 03/02/2017   Heart palpitations, with dizziness 04/07/2013   B12 DEFICIENCY 02/08/2010   LIVER HEMANGIOMA 01/31/2010   LIVER MASS 01/31/2010   HYPOTHYROIDISM 01/31/2010   DIVERTICULOSIS OF COLON 01/31/2010    Past Surgical History:  Procedure Laterality Date   BREAST BIOPSY  2012   left   BREAST EXCISIONAL BIOPSY Left 07/2010   CATARACT EXTRACTION     CESAREAN SECTION     COLONOSCOPY     2018   detached retina repair  2014   FOOT SURGERY     right   LAPAROSCOPIC OOPHERECTOMY Left      OB History   No obstetric history on file.     Family History  Problem Relation Age of Onset   Cancer Mother        uterine   Breast cancer  Mother    Other Father         heart bypass    Heart disease Father    Colon cancer Father        pt undergoing questionable colon cancer workup at time of death   Colon polyps Neg Hx    Esophageal cancer Neg Hx    Rectal cancer Neg Hx     Social History   Tobacco Use   Smoking status: Never   Smokeless tobacco: Never  Vaping Use   Vaping Use: Never used  Substance Use Topics   Alcohol use: Yes    Alcohol/week: 5.0 - 7.0 standard drinks    Types: 5 - 7 Glasses of wine per week   Drug use: No    Home Medications Prior to Admission medications   Medication Sig Start Date End Date Taking? Authorizing Provider  atorvastatin (LIPITOR) 10 MG tablet TAKE 1 TABLET BY MOUTH ONCE DAILY 06/05/20 06/05/21 Yes Reynold Bowen, MD  Bromfenac Sodium (PROLENSA) 0.07 % SOLN Prolensa 0.07 % eye drops  PLACE 1 DROP IN AFFECTED EYE TWICE A DAY   Yes [provider]  calcium carbonate (TUMS - DOSED IN MG ELEMENTAL CALCIUM) 500 MG chewable tablet Chew 1 tablet by mouth daily. Three  daily   Yes [provider]  cycloSPORINE (RESTASIS) 0.05 % ophthalmic emulsion INSTILL 1 DROP IN BOTH EYES 2 TIMES A DAY 08/20/20 08/20/21 Yes Stonecipher, Peterson Ao, MD  losartan (COZAAR) 25 MG tablet Take 25 mg by mouth daily. 09/20/19  Yes [provider]  SYNTHROID 100 MCG tablet TAKE 1 TABLET BY MOUTH DAILY AS DIRECTED 04/17/20 04/17/21 Yes Reynold Bowen, MD  atorvastatin (LIPITOR) 10 MG tablet Take 10 mg by mouth daily.    [provider]  Cholecalciferol (VITAMIN D-3 PO) Take by mouth.    [provider]  cycloSPORINE (RESTASIS) 0.05 % ophthalmic emulsion 1 drop 2 (two) times daily.    [provider]  Difluprednate 0.05 % EMUL Place 1 drop into the left eye 4 (four) times daily. 12/06/20     DUREZOL 0.05 % EMUL  09/21/19   [provider]  HYDROcodone bit-homatropine (HYCODAN) 5-1.5 MG/5ML syrup Take 5 mLs by mouth every 6 (six) hours as needed for cough. be  cautious of sedation 12/25/20     ibuprofen (ADVIL,MOTRIN) 200 MG tablet Take 200 mg by mouth every 6 (six) hours as needed for pain.    [provider]  losartan (COZAAR) 25 MG tablet TAKE 1 TABLET BY MOUTH ONCE DAILY FOR BLOOD PRESSURE. 06/18/20 06/18/21  Reynold Bowen, MD  Loteprednol Etabonate 0.25 % SUSP INSTILL 1 DROP INTO BOTH EYES FOUR TIMES A DAY USE FOR 1-2 WEEKS THEN STOP. PULSATE THERAPY 09/17/20 09/17/21  Vevelyn Royals, MD  predniSONE (DELTASONE) 10 MG tablet Take 1 tablet (10 mg total) by mouth 2 (two) times daily with food for 5 days 12/25/20     SYNTHROID 100 MCG tablet 1 mcg Daily. 01/07/11   [provider]  Zoster Vaccine Adjuvanted [facility]) injection TO BE ADMINISTERED BY PHARMACIST. QUE'D 11/05/20 08/28/20 08/28/21  Carlyle Basques, MD    Allergies    Patient has no known allergies.  Review of Systems   Review of Systems  Constitutional:  Negative for fever.  HENT:  Negative for ear pain.   Eyes:  Negative for pain.  Respiratory:  Negative for cough.   Cardiovascular:  Negative for chest pain.  Gastrointestinal:  Negative for abdominal pain.  Genitourinary:  Negative for flank pain.  Musculoskeletal:  Negative for back pain.  Skin:  Negative for rash.  Neurological:  Negative for headaches.   Physical Exam Updated Vital Signs BP (!) 141/86   Pulse 63   Temp 97.6 F (36.4 C)   Resp 18   Ht '5\' 6"'$  (1.676 m)   Wt 65.8 kg   SpO2 100%   BMI 23.40 kg/m   Physical Exam Vitals and nursing note reviewed.  Constitutional:      General: She is not in acute distress.    Appearance: She is well-developed.  HENT:     Head: Normocephalic and atraumatic.  Eyes:     Conjunctiva/sclera: Conjunctivae normal.  Cardiovascular:     Rate and Rhythm: Normal rate and regular rhythm.     Heart sounds: No murmur heard. Pulmonary:     Effort: Pulmonary effort is normal. No respiratory distress.     Breath sounds: Normal breath sounds.  Abdominal:      Palpations: Abdomen is soft.     Tenderness: There is no abdominal tenderness.  Musculoskeletal:     Cervical back: Neck supple.  Skin:    General: Skin is warm and dry.  Neurological:     General: No focal deficit present.  Mental Status: She is alert. Mental status is at baseline.     Cranial Nerves: No cranial nerve deficit.     Motor: No weakness.     Gait: Gait normal.    ED Results / Procedures / Treatments   Labs (all labs ordered are listed, but only abnormal results are displayed) Labs Reviewed  BASIC METABOLIC PANEL - Abnormal; Notable for the following components:      Result Value   Glucose, Bld 100 (*)    All other components within normal limits  CBC  TROPONIN I (HIGH SENSITIVITY)  TROPONIN I (HIGH SENSITIVITY)    EKG EKG Interpretation  Date/Time:  Saturday March 09 2021 18:52:38 EDT Ventricular Rate:  50 PR Interval:  132 QRS Duration: 82 QT Interval:  464 QTC Calculation: 423 R Axis:   6 Text Interpretation: Sinus bradycardia Minimal voltage criteria for LVH, may be normal variant ( R in aVL ) Borderline ECG Confirmed by Thamas Jaegers ([id number]) on 03/09/2021 8:13:37 PM  Radiology DG Chest 1 View  Result Date: 03/09/2021 CLINICAL DATA:  Intermittent palpitations. EXAM: CHEST  1 VIEW COMPARISON:  None. FINDINGS: The heart size and mediastinal contours are within normal limits. Both lungs are clear. The visualized skeletal structures are unremarkable. IMPRESSION: No active cardiopulmonary disease. Electronically Signed   By: Virgina Norfolk M.D.   On: 03/09/2021 19:41    Procedures Procedures   Medications Ordered in ED Medications - No data to display  ED Course  I have reviewed the triage vital signs and the nursing notes.  Pertinent labs & imaging results that were available during my care of the patient were reviewed by me and considered in my medical decision making (see chart for details).    MDM Rules/Calculators/A&P                            Patient has normal sinus rhythm mildly bradycardic but no other abnormal findings noted.  EKG shows no ST elevations or depressions normal rate mildly bradycardic sinus rhythm.  Troponins are negative x2 Adverse cardiac rhythms noted during the ER.  Unclear if the patient had paroxysmal atrial fibrillation or transient episode of mild tachycardia with rates in the 90s beats per minute per family.  Patient remains asymptomatic during her ER stay.  Will recommend outpatient follow-up with cardiology within the next 3 to 4 days.  Recommend immediate return for any chest pain difficulty breathing or any additional concerns.  Final Clinical Impression(s) / ED Diagnoses Final diagnoses:  Palpitations    Rx / DC Orders ED Discharge Orders     None        Luna Fuse, MD 03/09/21 2152

## 2021-03-09 NOTE — Discharge Instructions (Addendum)
Call your primary care doctor or specialist as discussed in the next 2-3 days.   Return immediately back to the ER if:  Your symptoms worsen within the next 12-24 hours. You develop new symptoms such as new fevers, persistent vomiting, new pain, shortness of breath, or new weakness or numbness, or if you have any other concerns.  

## 2021-03-09 NOTE — ED Triage Notes (Signed)
Pt reports intermittent palpitations and feeling like her HR is high and irregular. No hx of a-fib. Pt denies CP/SOB at this time.

## 2021-03-12 ENCOUNTER — Telehealth: Payer: Self-pay

## 2021-03-12 NOTE — Telephone Encounter (Signed)
   [facility] Pre-operative Risk Assessment    Patient Name: Jessica Herrera  DOB: 09/02/1955 MRN: [id number]  HEARTCARE STAFF:  - IMPORTANT!!!!!! Under Visit Info/Reason for Call, type in Other and utilize the format Clearance MM/DD/YY or Clearance TBD. Do not use dashes or single digits. - Please review there is not already an duplicate clearance open for this procedure. - If request is for dental extraction, please clarify the # of teeth to be extracted. - If the patient is currently at the dentist's office, call Pre-Op Callback Staff (MA/nurse) to input urgent request.  - If the patient is not currently in the dentist office, please route to the Pre-Op pool.  Request for surgical clearance:  What type of surgery is being performed? RIGHT THUMB [facility] ARTHROPLASTY WITH DOUBLE TENDON TRANSFER AND REPAIR RECONSTRUCTION IS NECESSARY-POSSIBLE TIGHT ROPE SUSPENSION  When is this surgery scheduled? 04-19-2021  What type of clearance is required (medical clearance vs. Pharmacy clearance to hold med vs. Both)? MEDICAL  Are there any medications that need to be held prior to surgery and how long? NONE LISTED  Practice name and name of physician performing surgery? [facility] DR Roseanne Kaufman ATTN: SHERRY  What is the office phone number? [phone]   7.   What is the office fax number? [phone]  8.   Anesthesia type (None, local, MAC, general) ? BLOCK WITH IV VERSED   Waylan Rocher 03/12/2021, 4:43 PM  _________________________________________________________________   (provider comments below)

## 2021-03-13 NOTE — Telephone Encounter (Addendum)
Pt is already scheduled for a new patient appt with Dr. Gwenlyn Found who will address preop clearance at that time.  I will remove from preop pool. Dr. Gwenlyn Found will fax clearance.

## 2021-03-22 ENCOUNTER — Ambulatory Visit (INDEPENDENT_AMBULATORY_CARE_PROVIDER_SITE_OTHER): Payer: 59

## 2021-03-22 ENCOUNTER — Ambulatory Visit: Payer: 59 | Admitting: Cardiovascular Disease

## 2021-03-22 ENCOUNTER — Other Ambulatory Visit: Payer: Self-pay

## 2021-03-22 ENCOUNTER — Encounter: Payer: Self-pay | Admitting: Cardiovascular Disease

## 2021-03-22 VITALS — BP 124/74 | HR 54 | Ht 66.0 in | Wt 150.8 lb

## 2021-03-22 DIAGNOSIS — I10 Essential (primary) hypertension: Secondary | ICD-10-CM

## 2021-03-22 DIAGNOSIS — R002 Palpitations: Secondary | ICD-10-CM | POA: Diagnosis not present

## 2021-03-22 DIAGNOSIS — R0600 Dyspnea, unspecified: Secondary | ICD-10-CM

## 2021-03-22 DIAGNOSIS — E782 Mixed hyperlipidemia: Secondary | ICD-10-CM | POA: Diagnosis not present

## 2021-03-22 DIAGNOSIS — E785 Hyperlipidemia, unspecified: Secondary | ICD-10-CM | POA: Insufficient documentation

## 2021-03-22 DIAGNOSIS — R0609 Other forms of dyspnea: Secondary | ICD-10-CM | POA: Insufficient documentation

## 2021-03-22 NOTE — Assessment & Plan Note (Signed)
History of palpitations evaluated in the past with a CardioNet monitor revealing isolated PACs, PVCs and short runs of atrial tachycardia.  She does drink some caffeine which she has cut out last several weeks.  Over the last several months she has noticed increasing frequency of her palpitations.  These become somewhat concerning to her.  I did see some rhythm strips from her mobile device that showed short runs of atrial tachycardia and PACs.  I am going to get TSH and free T4 on her as well as a 2-week Zio patch.

## 2021-03-22 NOTE — Assessment & Plan Note (Signed)
Jessica Herrera is very active and exercises 6 days a week.  Over the last several months she is noticed some increasing dyspnea when walking up a hill and went to her heart is exhibiting palpitations.  I am going to get a 2D echocardiogram to further evaluate

## 2021-03-22 NOTE — Progress Notes (Unsigned)
Enrolled patient for a 14 day Zio XT  monitor to be mailed to patients home  °

## 2021-03-22 NOTE — Assessment & Plan Note (Signed)
History of hyperlipidemia on low-dose atorvastatin with lipid profile performed 10/03/2020 revealing total cholesterol 190, LDL of 89 and HDL of 88.

## 2021-03-22 NOTE — Assessment & Plan Note (Signed)
History of essential hypertension a blood pressure measured today at 124/74.  She is on low-dose losartan.

## 2021-03-22 NOTE — Progress Notes (Signed)
03/22/2021 Jessica Herrera   May 14, 1956  [id number]  Primary Physician Reynold Bowen, MD Primary Cardiologist: Lorretta Harp MD Lupe Carney, [place]  HPI:  Jessica Herrera is a 65 y.o. fit appearing married Caucasian female mother of 72, grandmother of 5 grandchildren accompanied by her husband Dr. Tempie Hoist, an ophthalmologist here in town.  They were self-referred because of palpitations.  She was scheduled to have hand surgery today by Dr. Amedeo Plenty which was canceled because of her palpitations.  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  She had seen Dr. Ellouise Newer in the past back in 2014.  Her father did have CABG in his 68s.  She is never had a heart attack or stroke.  She is very active and exercises 6 days a week, 2 miles on the treadmill and does aerobics as well.  Over this past summer she is noticed increasing palpitations as well as dyspnea with with some lightheadedness.   Current Meds  Medication Sig   atorvastatin (LIPITOR) 10 MG tablet TAKE 1 TABLET BY MOUTH ONCE DAILY   Bromfenac Sodium (PROLENSA) 0.07 % SOLN Prolensa 0.07 % eye drops  PLACE 1 DROP IN AFFECTED EYE TWICE A DAY   calcium carbonate (TUMS - DOSED IN MG ELEMENTAL CALCIUM) 500 MG chewable tablet Chew 1 tablet by mouth daily. Three daily   Cholecalciferol (VITAMIN D-3 PO) Take by mouth.   cycloSPORINE (RESTASIS) 0.05 % ophthalmic emulsion INSTILL 1 DROP IN BOTH EYES 2 TIMES A DAY   Difluprednate 0.05 % EMUL Place 1 drop into the left eye 4 (four) times daily.   DUREZOL 0.05 % EMUL    HYDROcodone bit-homatropine (HYCODAN) 5-1.5 MG/5ML syrup Take 5 mLs by mouth every 6 (six) hours as needed for cough. be cautious of sedation   ibuprofen (ADVIL,MOTRIN) 200 MG tablet Take 200 mg by mouth every 6 (six) hours as needed for pain.   losartan (COZAAR) 25 MG tablet TAKE 1 TABLET BY MOUTH ONCE DAILY FOR BLOOD PRESSURE.   Loteprednol Etabonate 0.25 % SUSP INSTILL 1 DROP INTO BOTH EYES FOUR TIMES A  DAY USE FOR 1-2 WEEKS THEN STOP. PULSATE THERAPY   SYNTHROID 100 MCG tablet TAKE 1 TABLET BY MOUTH DAILY AS DIRECTED   Zoster Vaccine Adjuvanted (SHINGRIX) injection TO BE ADMINISTERED BY PHARMACIST. QUE'D 11/05/20   [DISCONTINUED] atorvastatin (LIPITOR) 10 MG tablet Take 10 mg by mouth daily.   [DISCONTINUED] cycloSPORINE (RESTASIS) 0.05 % ophthalmic emulsion 1 drop 2 (two) times daily.   [DISCONTINUED] losartan (COZAAR) 25 MG tablet Take 25 mg by mouth daily.   [DISCONTINUED] predniSONE (DELTASONE) 10 MG tablet Take 1 tablet (10 mg total) by mouth 2 (two) times daily with food for 5 days   [DISCONTINUED] SYNTHROID 100 MCG tablet 1 mcg Daily.   Current Facility-Administered Medications for the 03/22/21 encounter (Office Visit) with Lorretta Harp, MD  Medication   0.9 %  sodium chloride infusion   0.9 %  sodium chloride infusion     No Known Allergies  Social History   Socioeconomic History   Marital status: Married    Spouse name: Not on file   Number of children: 3   Years of education: Not on file   Highest education level: Not on file  Occupational History    Employer: UNEMPLOYED  Tobacco Use   Smoking status: Never   Smokeless tobacco: Never  Vaping Use   Vaping Use: Never used  Substance and Sexual  Activity   Alcohol use: Yes    Alcohol/week: 5.0 - 7.0 standard drinks    Types: 5 - 7 Glasses of wine per week   Drug use: No   Sexual activity: Yes  Other Topics Concern   Not on file  Social History Narrative   Not on file   Social Determinants of Health   Financial Resource Strain: Not on file  Food Insecurity: Not on file  Transportation Needs: Not on file  Physical Activity: Not on file  Stress: Not on file  Social Connections: Not on file  Intimate Partner Violence: Not on file     Review of Systems: General: negative for chills, fever, night sweats or weight changes.  Cardiovascular: negative for chest pain, dyspnea on exertion, edema, orthopnea,  palpitations, paroxysmal nocturnal dyspnea or shortness of breath Dermatological: negative for rash Respiratory: negative for cough or wheezing Urologic: negative for hematuria Abdominal: negative for nausea, vomiting, diarrhea, bright red blood per rectum, melena, or hematemesis Neurologic: negative for visual changes, syncope, or dizziness All other systems reviewed and are otherwise negative except as noted above.    Blood pressure 124/74, pulse (!) 54, height '5\' 6"'$  (1.676 m), weight 150 lb 12.8 oz (68.4 kg), SpO2 99 %.  General appearance: alert and no distress Neck: no adenopathy, no carotid bruit, no JVD, supple, symmetrical, trachea midline, and thyroid not enlarged, symmetric, no tenderness/mass/nodules Lungs: clear to auscultation bilaterally Heart: regular rate and rhythm, S1, S2 normal, no murmur, click, rub or gallop Extremities: extremities normal, atraumatic, no cyanosis or edema Pulses: 2+ and symmetric Skin: Skin color, texture, turgor normal. No rashes or lesions Neurologic: Grossly normal  EKG sinus bradycardia 49 without ST or T wave changes.  I personally reviewed this EKG.  ASSESSMENT AND PLAN:   Essential hypertension History of essential hypertension a blood pressure measured today at 124/74.  She is on low-dose losartan.  Hyperlipidemia History of hyperlipidemia on low-dose atorvastatin with lipid profile performed 10/03/2020 revealing total cholesterol 190, LDL of 89 and HDL of 88.  Dyspnea on exertion Jessica Herrera is very active and exercises 6 days a week.  Over the last several months she is noticed some increasing dyspnea when walking up a hill and went to her heart is exhibiting palpitations.  I am going to get a 2D echocardiogram to further evaluate  Heart palpitations, with dizziness History of palpitations evaluated in the past with a CardioNet monitor revealing isolated PACs, PVCs and short runs of atrial tachycardia.  She does drink some caffeine  which she has cut out last several weeks.  Over the last several months she has noticed increasing frequency of her palpitations.  These become somewhat concerning to her.  I did see some rhythm strips from her mobile device that showed short runs of atrial tachycardia and PACs.  I am going to get TSH and free T4 on her as well as a 2-week Zio patch.     Lorretta Harp MD FACP,FACC,FAHA, [facility] 03/22/2021 11:56 AM

## 2021-03-22 NOTE — Patient Instructions (Signed)
Medication Instructions:  The current medical regimen is effective;  continue present plan and medications.  *If you need a refill on your cardiac medications before your next appointment, please call your pharmacy*   Lab Work: TSH, free T4 today   If you have labs (blood work) drawn today and your tests are completely normal, you will receive your results only by: [place] (if you have MyChart) OR A paper copy in the mail If you have any lab test that is abnormal or we need to change your treatment, we will call you to review the results.   Testing/Procedures:  CALCIUM SCORE  Echocardiogram - Your physician has requested that you have an echocardiogram. Echocardiography is a painless test that uses sound waves to create images of your heart. It provides your doctor with information about the size and shape of your heart and how well your heart's chambers and valves are working. This procedure takes approximately one hour. There are no restrictions for this procedure. This will be performed at our [facility] location - [address], [address].  ZIO XT- Long Term Monitor Instructions  Your physician has requested you wear a ZIO patch monitor for 14 days.  This is a single patch monitor. [employer] supplies one patch monitor per enrollment. Additional stickers are not available. Please do not apply patch if you will be having a Nuclear Stress Test,  Echocardiogram, Cardiac CT, MRI, or Chest Xray during the period you would be wearing the  monitor. The patch cannot be worn during these tests. You cannot remove and re-apply the  ZIO XT patch monitor.  Your ZIO patch monitor will be mailed 3 day USPS to your address on file. It may take 3-5 days  to receive your monitor after you have been enrolled.  Once you have received your monitor, please review the enclosed instructions. Your monitor  has already been registered assigning a specific monitor serial # to you.  Billing and  Patient Assistance Program Information  We have supplied [employer] with any of your insurance information on file for billing purposes. [employer] offers a sliding scale Patient Assistance Program for patients that do not have  insurance, or whose insurance does not completely cover the cost of the ZIO monitor.  You must apply for the Patient Assistance Program to qualify for this discounted rate.  To apply, please call [employer] at [phone], select option 4, select option 2, ask to apply for  Patient Assistance Program. Theodore Demark will ask your household income, and how many people  are in your household. They will quote your out-of-pocket cost based on that information.  [employer] will also be able to set up a 10-month interest-free payment plan if needed.  Applying the monitor   Shave hair from upper left chest.  Hold abrader disc by orange tab. Rub abrader in 40 strokes over the upper left chest as  indicated in your monitor instructions.  Clean area with 4 enclosed alcohol pads. Let dry.  Apply patch as indicated in monitor instructions. Patch will be placed under collarbone on left  side of chest with arrow pointing upward.  Rub patch adhesive wings for 2 minutes. Remove white label marked "1". Remove the white  label marked "2". Rub patch adhesive wings for 2 additional minutes.  While looking in a mirror, press and release button in center of patch. A small green light will  flash 3-4 times. This will be your only indicator that the monitor has been turned  on.  Do not shower for the first 24 hours. You may shower after the first 24 hours.  Press the button if you feel a symptom. You will hear a small click. Record Date, Time and  Symptom in the Patient Logbook.  When you are ready to remove the patch, follow instructions on the last 2 pages of Patient  Logbook. Stick patch monitor onto the last page of Patient Logbook.  Place Patient Logbook in the blue and white box. Use locking tab on  box and tape box closed  securely. The blue and white box has prepaid postage on it. Please place it in the mailbox as  soon as possible. Your physician should have your test results approximately 7 days after the  monitor has been mailed back to [facility].  Call [place] [facility] at [phone] if you have questions regarding  your ZIO XT patch monitor. Call them immediately if you see an orange light blinking on your  monitor.  If your monitor falls off in less than 4 days, contact our Monitor department at [phone].  If your monitor becomes loose or falls off after 4 days call [employer] at [phone] for  suggestions on securing your monitor    Follow-Up: At [facility], you and your health needs are our priority.  As part of our continuing mission to provide you with exceptional heart care, we have created designated Provider Care Teams.  These Care Teams include your primary Cardiologist (physician) and Advanced Practice Providers (APPs -  Physician Assistants and Nurse Practitioners) who all work together to provide you with the care you need, when you need it.  We recommend signing up for the patient portal called "MyChart".  Sign up information is provided on this After Visit Summary.  MyChart is used to connect with patients for Virtual Visits (Telemedicine).  Patients are able to view lab/test results, encounter notes, upcoming appointments, etc.  Non-urgent messages can be sent to your provider as well.   To learn more about what you can do with MyChart, go to [email or web address].    Your next appointment:   1 month(s)  The format for your next appointment:   In Person  Provider:   Quay Burow, MD

## 2021-03-23 LAB — T4, FREE: Free T4: 1.41 ng/dL (ref 0.82–1.77)

## 2021-03-23 LAB — TSH: TSH: 0.65 u[IU]/mL (ref 0.450–4.500)

## 2021-03-25 ENCOUNTER — Other Ambulatory Visit (HOSPITAL_COMMUNITY): Payer: Self-pay

## 2021-04-01 DIAGNOSIS — R002 Palpitations: Secondary | ICD-10-CM | POA: Diagnosis not present

## 2021-04-03 ENCOUNTER — Other Ambulatory Visit (HOSPITAL_COMMUNITY): Payer: Self-pay

## 2021-04-03 DIAGNOSIS — E039 Hypothyroidism, unspecified: Secondary | ICD-10-CM | POA: Diagnosis not present

## 2021-04-03 DIAGNOSIS — E538 Deficiency of other specified B group vitamins: Secondary | ICD-10-CM | POA: Diagnosis not present

## 2021-04-03 DIAGNOSIS — E785 Hyperlipidemia, unspecified: Secondary | ICD-10-CM | POA: Diagnosis not present

## 2021-04-03 MED ORDER — CYCLOSPORINE 0.05 % OP EMUL
OPHTHALMIC | 3 refills | Status: AC
  Filled 2021-04-03: qty 180, 90d supply, fill #0
  Filled 2021-06-27: qty 180, 90d supply, fill #1

## 2021-04-05 ENCOUNTER — Other Ambulatory Visit (HOSPITAL_COMMUNITY): Payer: Self-pay

## 2021-04-10 DIAGNOSIS — D649 Anemia, unspecified: Secondary | ICD-10-CM | POA: Diagnosis not present

## 2021-04-10 DIAGNOSIS — I10 Essential (primary) hypertension: Secondary | ICD-10-CM | POA: Diagnosis not present

## 2021-04-10 DIAGNOSIS — I700 Atherosclerosis of aorta: Secondary | ICD-10-CM | POA: Diagnosis not present

## 2021-04-10 DIAGNOSIS — D1803 Hemangioma of intra-abdominal structures: Secondary | ICD-10-CM | POA: Diagnosis not present

## 2021-04-10 DIAGNOSIS — E039 Hypothyroidism, unspecified: Secondary | ICD-10-CM | POA: Diagnosis not present

## 2021-04-10 DIAGNOSIS — Z23 Encounter for immunization: Secondary | ICD-10-CM | POA: Diagnosis not present

## 2021-04-10 DIAGNOSIS — Z1331 Encounter for screening for depression: Secondary | ICD-10-CM | POA: Diagnosis not present

## 2021-04-10 DIAGNOSIS — E785 Hyperlipidemia, unspecified: Secondary | ICD-10-CM | POA: Diagnosis not present

## 2021-04-10 DIAGNOSIS — Z1339 Encounter for screening examination for other mental health and behavioral disorders: Secondary | ICD-10-CM | POA: Diagnosis not present

## 2021-04-10 DIAGNOSIS — R002 Palpitations: Secondary | ICD-10-CM | POA: Diagnosis not present

## 2021-04-10 DIAGNOSIS — Z0000 Encounter for general adult medical examination without abnormal findings: Secondary | ICD-10-CM | POA: Diagnosis not present

## 2021-04-10 DIAGNOSIS — R82998 Other abnormal findings in urine: Secondary | ICD-10-CM | POA: Diagnosis not present

## 2021-04-11 ENCOUNTER — Other Ambulatory Visit (HOSPITAL_COMMUNITY): Payer: Self-pay

## 2021-04-12 ENCOUNTER — Other Ambulatory Visit (HOSPITAL_COMMUNITY): Payer: Self-pay

## 2021-04-12 MED ORDER — SYNTHROID 100 MCG PO TABS
100.0000 ug | ORAL_TABLET | Freq: Every day | ORAL | 3 refills | Status: DC
  Filled 2021-04-12: qty 90, 90d supply, fill #0
  Filled 2021-07-10: qty 90, 90d supply, fill #1
  Filled 2021-10-07: qty 90, 90d supply, fill #2
  Filled 2022-01-06: qty 90, 90d supply, fill #3

## 2021-04-15 ENCOUNTER — Encounter: Payer: Self-pay | Admitting: *Deleted

## 2021-04-22 ENCOUNTER — Ambulatory Visit (HOSPITAL_COMMUNITY): Payer: 59 | Attending: Cardiology

## 2021-04-22 ENCOUNTER — Ambulatory Visit (INDEPENDENT_AMBULATORY_CARE_PROVIDER_SITE_OTHER)
Admission: RE | Admit: 2021-04-22 | Discharge: 2021-04-22 | Disposition: A | Discharge status: Home / Self Care | Payer: Self-pay | Source: Ambulatory Visit | Attending: Cardiovascular Disease | Admitting: Cardiovascular Disease

## 2021-04-22 ENCOUNTER — Other Ambulatory Visit: Payer: Self-pay

## 2021-04-22 DIAGNOSIS — R002 Palpitations: Secondary | ICD-10-CM | POA: Diagnosis not present

## 2021-04-22 DIAGNOSIS — I10 Essential (primary) hypertension: Secondary | ICD-10-CM

## 2021-04-22 DIAGNOSIS — R0609 Other forms of dyspnea: Secondary | ICD-10-CM

## 2021-04-22 LAB — ECHOCARDIOGRAM COMPLETE
Area-P 1/2: 2.26 cm2
S' Lateral: 2.6 cm

## 2021-04-23 ENCOUNTER — Ambulatory Visit: Payer: 59 | Admitting: Cardiovascular Disease

## 2021-04-23 ENCOUNTER — Encounter: Payer: Self-pay | Admitting: Cardiovascular Disease

## 2021-04-23 VITALS — BP 102/54 | HR 62 | Ht 66.0 in | Wt 148.6 lb

## 2021-04-23 DIAGNOSIS — I471 Supraventricular tachycardia: Secondary | ICD-10-CM | POA: Diagnosis not present

## 2021-04-23 NOTE — Addendum Note (Signed)
Addended by: Beatrix Fetters on: 04/23/2021 04:05 PM   Modules accepted: Orders

## 2021-04-23 NOTE — Patient Instructions (Signed)

## 2021-04-23 NOTE — Progress Notes (Signed)
-  Ms. Emerick returns today for follow-up of her noninvasive test.  Her 2D echo was essentially normal except for dysfunction.  Coronary calcium score was 0.  Her event monitor showed sinus bradycardia, sinus rhythm with sinus tachycardia.  Her heart rates were somewhat slow in the early morning hours.  She did have episodes of SVT which were self-limited and occasional PACs and PVCs.  She does notice that her heart rate goes up higher than usual walking up to 17 stairs in her house.  She has had rare episodes of presyncope.  I am referring him recurred to the EP service for further evaluation.  Lorretta Harp, M.D., [place], [facility], [place] [address] [place], [place]  [address]  [phone] 04/23/2021 3:58 PM

## 2021-04-29 DIAGNOSIS — Z6825 Body mass index (BMI) 25.0-25.9, adult: Secondary | ICD-10-CM | POA: Diagnosis not present

## 2021-04-29 DIAGNOSIS — Z01419 Encounter for gynecological examination (general) (routine) without abnormal findings: Secondary | ICD-10-CM | POA: Diagnosis not present

## 2021-04-29 DIAGNOSIS — Z8052 Family history of malignant neoplasm of bladder: Secondary | ICD-10-CM | POA: Diagnosis not present

## 2021-04-29 DIAGNOSIS — Z808 Family history of malignant neoplasm of other organs or systems: Secondary | ICD-10-CM | POA: Diagnosis not present

## 2021-04-29 DIAGNOSIS — Z8049 Family history of malignant neoplasm of other genital organs: Secondary | ICD-10-CM | POA: Diagnosis not present

## 2021-04-29 DIAGNOSIS — Z806 Family history of leukemia: Secondary | ICD-10-CM | POA: Diagnosis not present

## 2021-04-29 DIAGNOSIS — Z803 Family history of malignant neoplasm of breast: Secondary | ICD-10-CM | POA: Diagnosis not present

## 2021-04-30 ENCOUNTER — Other Ambulatory Visit: Payer: Self-pay | Admitting: Obstetrics and Gynecology

## 2021-04-30 DIAGNOSIS — Z803 Family history of malignant neoplasm of breast: Secondary | ICD-10-CM

## 2021-05-01 NOTE — Progress Notes (Signed)
Electrophysiology Office Note:    Date:  05/02/2021   ID:  Jessica Herrera, DOB 1956/04/13, MRN [id number]  PCP:  Reynold Bowen, MD  [facility] Cardiologist:  None  [facility] Electrophysiologist:  Vickie Epley, MD   Referring MD: Lorretta Harp, MD   Chief Complaint: SVT  History of Present Illness:    Jessica Herrera is a 65 y.o. female who presents for an evaluation of SVT at the request of Dr Gwenlyn Found. Their medical history includes HTN, HLD, thyroid disease. The patient last saw Dr. Gwenlyn Found on April 23, 2021.  This visit was to follow-up a heart monitor which showed short salvos of atrial tachycardia.  In her history she has rare episodes of presyncope. She previously seen Dr. Gwenlyn Found on March 22, 2021.  At that visit she reported increasing palpitations over the preceding summer.  She is very active exercising 6 days a week. Today she tells me she continues to exercise almost daily.  She walks at least 2 miles on a treadmill.     Past Medical History:  Diagnosis Date   Arthritis    wrists   Cataracts, bilateral    bilateral repair   GERD (gastroesophageal reflux disease)    self report   Hyperlipidemia    Hypertension    Night sweats    Palpitations    Thyroid disease 1976    Past Surgical History:  Procedure Laterality Date   BREAST BIOPSY  2012   left   BREAST EXCISIONAL BIOPSY Left 07/2010   CATARACT EXTRACTION     CESAREAN SECTION     COLONOSCOPY     2018   detached retina repair  2014   FOOT SURGERY     right   LAPAROSCOPIC OOPHERECTOMY Left     Current Medications: No outpatient medications have been marked as taking for the 05/02/21 encounter (Office Visit) with Vickie Epley, MD.   Current Facility-Administered Medications for the 05/02/21 encounter (Office Visit) with Vickie Epley, MD  Medication   0.9 %  sodium chloride infusion   0.9 %  sodium chloride infusion     Allergies:   Patient has no known  allergies.   Social History   Socioeconomic History   Marital status: Married    Spouse name: Not on file   Number of children: 3   Years of education: Not on file   Highest education level: Not on file  Occupational History    Employer: UNEMPLOYED  Tobacco Use   Smoking status: Never   Smokeless tobacco: Never  Vaping Use   Vaping Use: Never used  Substance and Sexual Activity   Alcohol use: Yes    Alcohol/week: 5.0 - 7.0 standard drinks    Types: 5 - 7 Glasses of wine per week   Drug use: No   Sexual activity: Yes  Other Topics Concern   Not on file  Social History Narrative   Not on file   Social Determinants of Health   Financial Resource Strain: Not on file  Food Insecurity: Not on file  Transportation Needs: Not on file  Physical Activity: Not on file  Stress: Not on file  Social Connections: Not on file     Family History: The patient's family history includes Breast cancer in her mother; Cancer in her mother; Colon cancer in her father; Heart disease in her father; Other in her father. There is no history of Colon polyps, Esophageal cancer, or Rectal cancer.  ROS:  Please see the history of present illness.    All other systems reviewed and are negative.  EKGs/Labs/Other Studies Reviewed:    The following studies were reviewed today:  April 22, 2021 echo Left ventricular function normal, 60% Right ventricular function normal Mild MR Trivial AI  April 24, 2021 ZIO personally reviewed Heart rate 37-1 62, average 62 bpm 13 SVTs, longest lasting 17 beats at a rate of 95 bpm Rare supraventricular ectopy Rare ventricular ectopy  Recent Labs: 03/09/2021: BUN 23; Creatinine, Ser 0.79; Hemoglobin 14.2; Platelets 176; Potassium 3.9; Sodium 140 03/22/2021: TSH 0.650  Recent Lipid Panel No results found for: CHOL, TRIG, HDL, CHOLHDL, VLDL, LDLCALC, LDLDIRECT  Physical Exam:    VS:  BP 126/84   Pulse (!) 57   Ht 5\' 6"  (1.676 m)   Wt 152 lb 9.6  oz (69.2 kg)   SpO2 96%   BMI 24.63 kg/m     Wt Readings from Last 3 Encounters:  05/02/21 152 lb 9.6 oz (69.2 kg)  04/23/21 148 lb 9.6 oz (67.4 kg)  03/22/21 150 lb 12.8 oz (68.4 kg)     GEN:  Well nourished, well developed in no acute distress HEENT: Normal NECK: No JVD; No carotid bruits LYMPHATICS: No lymphadenopathy CARDIAC: RRR, no murmurs, rubs, gallops RESPIRATORY:  Clear to auscultation without rales, wheezing or rhonchi  ABDOMEN: Soft, non-tender, non-distended MUSCULOSKELETAL:  No edema; No deformity  SKIN: Warm and dry NEUROLOGIC:  Alert and oriented x 3 PSYCHIATRIC:  Normal affect   ASSESSMENT:    1. SVT (supraventricular tachycardia) ([place])   2. Heart palpitations, with dizziness    PLAN:    In order of problems listed above:  #PACs/palpitations/SVT We spent some time today in our clinic visit reviewing the ZIO monitor in great detail.  Her ZIO monitor shows evidence of PACs and brief salvos of atrial tachycardia.  I suspect this is what she has felt in the past.  Thankfully, she has overall low burden of PACs/atrial tachycardia.  I discussed the management options with her.  For now, given the low burden, would recommend a conservative management strategy.  If the burden of her atrial tachycardia/palpitations increases in the future, I have asked her to reach back out to [place].  We would repeat a ZIO monitor at that time to exclude new arrhythmias such as atrial fibrillation.  I would encourage her to continue her regular exercise.    #Hypertension Controlled.  Continue losartan   Follow-up as needed.       Total time spent with patient today 45 minutes. This includes reviewing records, evaluating the patient and coordinating care.  Medication Adjustments/Labs and Tests Ordered: Current medicines are reviewed at length with the patient today.  Concerns regarding medicines are outlined above.  No orders of the defined types were placed in this  encounter.  No orders of the defined types were placed in this encounter.    Signed, Hilton Cork. Quentin Ore, MD, [facility], [facility] 05/02/2021 2:48 PM    Electrophysiology [facility]

## 2021-05-02 ENCOUNTER — Other Ambulatory Visit: Payer: Self-pay

## 2021-05-02 ENCOUNTER — Ambulatory Visit: Payer: 59 | Admitting: Cardiology

## 2021-05-02 VITALS — BP 126/84 | HR 57 | Ht 66.0 in | Wt 152.6 lb

## 2021-05-02 DIAGNOSIS — R002 Palpitations: Secondary | ICD-10-CM | POA: Diagnosis not present

## 2021-05-02 DIAGNOSIS — I471 Supraventricular tachycardia, unspecified: Secondary | ICD-10-CM

## 2021-05-02 DIAGNOSIS — I10 Essential (primary) hypertension: Secondary | ICD-10-CM

## 2021-05-02 NOTE — Patient Instructions (Addendum)
Medication Instructions:  Your physician recommends that you continue on your current medications as directed. Please refer to the Current Medication list given to you today. *If you need a refill on your cardiac medications before your next appointment, please call your pharmacy*  Lab Work: None ordered. If you have labs (blood work) drawn today and your tests are completely normal, you will receive your results only by: MyChart Message (if you have MyChart) OR A paper copy in the mail If you have any lab test that is abnormal or we need to change your treatment, we will call you to review the results.  Testing/Procedures: None ordered.  Follow-Up: At [facility], you and your health needs are our priority.  As part of our continuing mission to provide you with exceptional heart care, we have created designated Provider Care Teams.  These Care Teams include your primary Cardiologist (physician) and Advanced Practice Providers (APPs -  Physician Assistants and Nurse Practitioners) who all work together to provide you with the care you need, when you need it.  Your next appointment:   Your physician wants you to follow-up in: as needed   

## 2021-05-07 DIAGNOSIS — R140 Abdominal distension (gaseous): Secondary | ICD-10-CM | POA: Diagnosis not present

## 2021-05-17 ENCOUNTER — Other Ambulatory Visit (HOSPITAL_COMMUNITY): Payer: Self-pay

## 2021-05-17 MED FILL — Losartan Potassium Tab 25 MG: ORAL | 30 days supply | Qty: 30 | Fill #2 | Status: AC

## 2021-05-21 ENCOUNTER — Other Ambulatory Visit (HOSPITAL_COMMUNITY): Payer: Self-pay

## 2021-05-21 MED ORDER — BRIMONIDINE TARTRATE 0.2 % OP SOLN
1.0000 [drp] | Freq: Two times a day (BID) | OPHTHALMIC | 6 refills | Status: DC
  Filled 2021-05-21 – 2021-05-24 (×2): qty 5, 50d supply, fill #0

## 2021-05-22 ENCOUNTER — Ambulatory Visit: Payer: 59 | Attending: Internal Medicine

## 2021-05-22 ENCOUNTER — Other Ambulatory Visit (HOSPITAL_BASED_OUTPATIENT_CLINIC_OR_DEPARTMENT_OTHER): Payer: Self-pay

## 2021-05-22 DIAGNOSIS — Z23 Encounter for immunization: Secondary | ICD-10-CM

## 2021-05-22 MED ORDER — PFIZER COVID-19 VAC BIVALENT 30 MCG/0.3ML IM SUSP
INTRAMUSCULAR | 0 refills | Status: DC
  Filled 2021-05-22: qty 0.3, 1d supply, fill #0

## 2021-05-22 NOTE — Progress Notes (Signed)
   Covid-19 Vaccination Clinic  Name:  Jessica Herrera    MRN: [id number] DOB: Aug 08, 1955  05/22/2021  [name] was observed post Covid-19 immunization for 15 minutes without incident. She was provided with Vaccine Information Sheet and instruction to access the V-Safe system.   [name] was instructed to call 911 with any severe reactions post vaccine: Difficulty breathing  Swelling of face and throat  A fast heartbeat  A bad rash all over body  Dizziness and weakness   Immunizations Administered     Name Date Dose VIS Date Route   Pfizer Covid-19 Vaccine Bivalent Booster 05/22/2021  2:38 PM 0.3 mL 03/27/2021 Intramuscular   Manufacturer: [place]   Lot: [id number]   [place]: [phone]

## 2021-05-23 ENCOUNTER — Other Ambulatory Visit (HOSPITAL_COMMUNITY): Payer: Self-pay

## 2021-05-24 ENCOUNTER — Other Ambulatory Visit (HOSPITAL_COMMUNITY): Payer: Self-pay

## 2021-05-29 ENCOUNTER — Other Ambulatory Visit (HOSPITAL_COMMUNITY): Payer: Self-pay

## 2021-05-29 MED ORDER — MELOXICAM 7.5 MG PO TABS
7.5000 mg | ORAL_TABLET | Freq: Every day | ORAL | 11 refills | Status: DC
  Filled 2021-05-29: qty 30, 30d supply, fill #0
  Filled 2021-06-27: qty 30, 30d supply, fill #1

## 2021-05-29 MED ORDER — LOTEMAX SM 0.38 % OP GEL
1.0000 [drp] | Freq: Two times a day (BID) | OPHTHALMIC | 5 refills | Status: DC
  Filled 2021-05-29: qty 5, 50d supply, fill #0
  Filled 2021-10-31: qty 5, 50d supply, fill #1
  Filled 2022-01-08: qty 5, 50d supply, fill #2
  Filled 2022-02-17: qty 5, 50d supply, fill #3

## 2021-05-29 MED ORDER — BRIMONIDINE TARTRATE-TIMOLOL 0.2-0.5 % OP SOLN
1.0000 [drp] | Freq: Two times a day (BID) | OPHTHALMIC | 3 refills | Status: DC
  Filled 2021-05-29: qty 5, 50d supply, fill #0

## 2021-05-30 ENCOUNTER — Other Ambulatory Visit: Payer: Self-pay

## 2021-05-30 ENCOUNTER — Encounter (INDEPENDENT_AMBULATORY_CARE_PROVIDER_SITE_OTHER): Payer: 59 | Admitting: Ophthalmology

## 2021-05-30 ENCOUNTER — Encounter (INDEPENDENT_AMBULATORY_CARE_PROVIDER_SITE_OTHER): Payer: Self-pay

## 2021-05-30 ENCOUNTER — Institutional Professional Consult (permissible substitution): Payer: 59 | Admitting: Cardiology

## 2021-06-03 ENCOUNTER — Other Ambulatory Visit (HOSPITAL_COMMUNITY): Payer: Self-pay

## 2021-06-05 ENCOUNTER — Other Ambulatory Visit (HOSPITAL_COMMUNITY): Payer: Self-pay

## 2021-06-06 ENCOUNTER — Other Ambulatory Visit (HOSPITAL_COMMUNITY): Payer: Self-pay

## 2021-06-11 ENCOUNTER — Other Ambulatory Visit (HOSPITAL_COMMUNITY): Payer: Self-pay

## 2021-06-11 MED ORDER — ATORVASTATIN CALCIUM 10 MG PO TABS
10.0000 mg | ORAL_TABLET | Freq: Every day | ORAL | 3 refills | Status: DC
  Filled 2021-06-11: qty 90, 90d supply, fill #0
  Filled 2021-09-11: qty 90, 90d supply, fill #1
  Filled 2021-12-10: qty 90, 90d supply, fill #2
  Filled 2022-03-10: qty 90, 90d supply, fill #3

## 2021-06-14 ENCOUNTER — Other Ambulatory Visit (HOSPITAL_COMMUNITY): Payer: Self-pay

## 2021-06-14 MED ORDER — LOSARTAN POTASSIUM 25 MG PO TABS
25.0000 mg | ORAL_TABLET | Freq: Every day | ORAL | 3 refills | Status: DC
  Filled 2021-06-14: qty 90, 90d supply, fill #0
  Filled 2021-09-11: qty 90, 90d supply, fill #1
  Filled 2021-12-10: qty 90, 90d supply, fill #2
  Filled 2022-03-10: qty 90, 90d supply, fill #3

## 2021-06-24 ENCOUNTER — Other Ambulatory Visit (HOSPITAL_COMMUNITY): Payer: Self-pay

## 2021-06-25 ENCOUNTER — Other Ambulatory Visit (HOSPITAL_COMMUNITY): Payer: Self-pay

## 2021-06-25 ENCOUNTER — Telehealth: Payer: Self-pay

## 2021-06-25 MED ORDER — CYCLOSPORINE 0.05 % OP EMUL: 1.0000 [drp] | Freq: Two times a day (BID) | OPHTHALMIC | 1 refills | Status: AC

## 2021-06-25 MED ORDER — LOTEMAX SM 0.38 % OP GEL: 1.0000 [drp] | Freq: Two times a day (BID) | OPHTHALMIC | 6 refills | Status: DC

## 2021-06-25 MED ORDER — BRIMONIDINE TARTRATE 0.2 % OP SOLN
1.0000 [drp] | Freq: Two times a day (BID) | OPHTHALMIC | 6 refills | Status: DC
  Filled 2021-06-25: qty 10, 90d supply, fill #0

## 2021-06-25 MED ORDER — BRINZOLAMIDE-BRIMONIDINE 1-0.2 % OP SUSP
1.0000 [drp] | Freq: Two times a day (BID) | OPHTHALMIC | 6 refills | Status: DC
  Filled 2021-06-25: qty 8, 80d supply, fill #0

## 2021-06-25 NOTE — Telephone Encounter (Signed)
   [facility] Pre-operative Risk Assessment    Patient Name: Jessica Herrera  DOB: 08-29-1955 MRN: [id number]  HEARTCARE STAFF:  - IMPORTANT!!!!!! Under Visit Info/Reason for Call, type in Other and utilize the format Clearance MM/DD/YY or Clearance TBD. Do not use dashes or single digits. - Please review there is not already an duplicate clearance open for this procedure. - If request is for dental extraction, please clarify the # of teeth to be extracted. - If the patient is currently at the dentist's office, call Pre-Op Callback Staff (MA/nurse) to input urgent request.  - If the patient is not currently in the dentist office, please route to the Pre-Op pool.  Request for surgical clearance:  What type of surgery is being performed? RIGHT THUMB [facility] ARTHROPLASTY& REPAIR RECONSTRUCTION IF NECESSARY  When is this surgery scheduled? TBD  What type of clearance is required (medical clearance vs. Pharmacy clearance to hold med vs. Both)? MEDICAL  Are there any medications that need to be held prior to surgery and how long? NONE LISTED  Practice name and name of physician performing surgery? [facility] DR Roseanne Kaufman  What is the office phone number? [phone]   7.   What is the office fax number? [phone]  8.   Anesthesia type (None, local, MAC, general) ? BLOCK WITH IV SED   Waylan Rocher 06/25/2021, 4:05 PM  _________________________________________________________________   (provider comments below)

## 2021-06-26 ENCOUNTER — Other Ambulatory Visit (HOSPITAL_COMMUNITY): Payer: Self-pay

## 2021-06-26 NOTE — Telephone Encounter (Signed)
    Patient Name: Jessica Herrera  DOB: 08/10/1955 MRN: [id number]  Primary Cardiologist: Dr. Gwenlyn Found  Chart reviewed as part of pre-operative protocol coverage. Patient was recently seen by Dr. Quentin Ore in 04/2021 for further evaluation of SVT noted on recent monitor. Plan was to continued with conservative management at that time. Patient was contacted today for further evaluation and reported doing well since last visit. She states palpitations are stable and denies any chest pain or shortness of breath. She states she is "doing good." Recent coronary calcium score in 03/2021 was 0 indicating she is at low risk for cardiac events. Recent Echo in 03/2021 showed normal LV function. Given past medical history and time since last visit, based on ACC/AHA guidelines, Vincenta Steffey would be at acceptable risk for the planned procedure without further cardiovascular testing.   I will route this recommendation to the requesting party via Epic fax function and remove from pre-op pool.  Please call with questions.  Darreld Mclean, PA-C 06/26/2021, 11:39 AM

## 2021-06-27 ENCOUNTER — Other Ambulatory Visit (HOSPITAL_COMMUNITY): Payer: Self-pay

## 2021-07-03 ENCOUNTER — Other Ambulatory Visit (HOSPITAL_COMMUNITY): Payer: Self-pay

## 2021-07-10 ENCOUNTER — Other Ambulatory Visit (HOSPITAL_COMMUNITY): Payer: Self-pay

## 2021-07-16 DIAGNOSIS — D1801 Hemangioma of skin and subcutaneous tissue: Secondary | ICD-10-CM | POA: Diagnosis not present

## 2021-07-16 DIAGNOSIS — D2261 Melanocytic nevi of right upper limb, including shoulder: Secondary | ICD-10-CM | POA: Diagnosis not present

## 2021-07-16 DIAGNOSIS — D225 Melanocytic nevi of trunk: Secondary | ICD-10-CM | POA: Diagnosis not present

## 2021-07-16 DIAGNOSIS — L821 Other seborrheic keratosis: Secondary | ICD-10-CM | POA: Diagnosis not present

## 2021-08-30 ENCOUNTER — Other Ambulatory Visit (HOSPITAL_COMMUNITY): Payer: Self-pay

## 2021-08-30 DIAGNOSIS — M1811 Unilateral primary osteoarthritis of first carpometacarpal joint, right hand: Secondary | ICD-10-CM | POA: Diagnosis not present

## 2021-08-30 DIAGNOSIS — M19031 Primary osteoarthritis, right wrist: Secondary | ICD-10-CM | POA: Diagnosis not present

## 2021-08-30 MED ORDER — ONDANSETRON 8 MG PO TBDP
8.0000 mg | ORAL_TABLET | Freq: Three times a day (TID) | ORAL | 0 refills | Status: DC | PRN
Start: 2021-08-30 — End: 2023-04-21
  Filled 2021-08-30: qty 15, 5d supply, fill #0

## 2021-08-30 MED ORDER — METHOCARBAMOL 500 MG PO TABS
500.0000 mg | ORAL_TABLET | Freq: Four times a day (QID) | ORAL | 0 refills | Status: DC | PRN
  Filled 2021-08-30: qty 40, 10d supply, fill #0

## 2021-08-30 MED ORDER — OXYCODONE HCL 5 MG PO TABS
5.0000 mg | ORAL_TABLET | ORAL | 0 refills | Status: DC | PRN
Start: 2021-08-30 — End: 2023-04-21
  Filled 2021-08-30: qty 42, 7d supply, fill #0

## 2021-08-30 MED ORDER — CEPHALEXIN 500 MG PO CAPS
500.0000 mg | ORAL_CAPSULE | Freq: Four times a day (QID) | ORAL | 0 refills | Status: DC
  Filled 2021-08-30: qty 20, 5d supply, fill #0

## 2021-09-11 ENCOUNTER — Other Ambulatory Visit (HOSPITAL_COMMUNITY): Payer: Self-pay

## 2021-09-12 DIAGNOSIS — Z4789 Encounter for other orthopedic aftercare: Secondary | ICD-10-CM | POA: Diagnosis not present

## 2021-09-12 DIAGNOSIS — M1811 Unilateral primary osteoarthritis of first carpometacarpal joint, right hand: Secondary | ICD-10-CM | POA: Diagnosis not present

## 2021-09-13 ENCOUNTER — Other Ambulatory Visit (HOSPITAL_COMMUNITY): Payer: Self-pay

## 2021-09-13 MED ORDER — TRAMADOL HCL 50 MG PO TABS
50.0000 mg | ORAL_TABLET | ORAL | 0 refills | Status: DC
  Filled 2021-09-13: qty 50, 13d supply, fill #0

## 2021-09-26 DIAGNOSIS — Z4789 Encounter for other orthopedic aftercare: Secondary | ICD-10-CM | POA: Diagnosis not present

## 2021-09-26 DIAGNOSIS — M189 Osteoarthritis of first carpometacarpal joint, unspecified: Secondary | ICD-10-CM | POA: Diagnosis not present

## 2021-09-26 DIAGNOSIS — M1811 Unilateral primary osteoarthritis of first carpometacarpal joint, right hand: Secondary | ICD-10-CM | POA: Diagnosis not present

## 2021-09-26 DIAGNOSIS — M25641 Stiffness of right hand, not elsewhere classified: Secondary | ICD-10-CM | POA: Diagnosis not present

## 2021-10-02 ENCOUNTER — Other Ambulatory Visit (HOSPITAL_COMMUNITY): Payer: Self-pay

## 2021-10-02 MED ORDER — METHYLPREDNISOLONE 4 MG PO TBPK
ORAL_TABLET | ORAL | 0 refills | Status: DC
  Filled 2021-10-02: qty 21, 6d supply, fill #0

## 2021-10-07 ENCOUNTER — Other Ambulatory Visit (HOSPITAL_COMMUNITY): Payer: Self-pay

## 2021-10-08 ENCOUNTER — Other Ambulatory Visit (HOSPITAL_COMMUNITY): Payer: Self-pay

## 2021-10-08 ENCOUNTER — Other Ambulatory Visit: Payer: Self-pay

## 2021-10-08 DIAGNOSIS — M25641 Stiffness of right hand, not elsewhere classified: Secondary | ICD-10-CM | POA: Diagnosis not present

## 2021-10-09 ENCOUNTER — Other Ambulatory Visit (HOSPITAL_COMMUNITY): Payer: Self-pay

## 2021-10-09 ENCOUNTER — Other Ambulatory Visit: Payer: Self-pay

## 2021-10-15 DIAGNOSIS — M25641 Stiffness of right hand, not elsewhere classified: Secondary | ICD-10-CM | POA: Diagnosis not present

## 2021-10-17 DIAGNOSIS — M1811 Unilateral primary osteoarthritis of first carpometacarpal joint, right hand: Secondary | ICD-10-CM | POA: Diagnosis not present

## 2021-10-17 DIAGNOSIS — Z4789 Encounter for other orthopedic aftercare: Secondary | ICD-10-CM | POA: Diagnosis not present

## 2021-10-17 DIAGNOSIS — M189 Osteoarthritis of first carpometacarpal joint, unspecified: Secondary | ICD-10-CM | POA: Diagnosis not present

## 2021-10-18 DIAGNOSIS — E039 Hypothyroidism, unspecified: Secondary | ICD-10-CM | POA: Diagnosis not present

## 2021-10-18 DIAGNOSIS — I700 Atherosclerosis of aorta: Secondary | ICD-10-CM | POA: Diagnosis not present

## 2021-10-18 DIAGNOSIS — D1803 Hemangioma of intra-abdominal structures: Secondary | ICD-10-CM | POA: Diagnosis not present

## 2021-10-18 DIAGNOSIS — R002 Palpitations: Secondary | ICD-10-CM | POA: Diagnosis not present

## 2021-10-18 DIAGNOSIS — E785 Hyperlipidemia, unspecified: Secondary | ICD-10-CM | POA: Diagnosis not present

## 2021-10-18 DIAGNOSIS — I38 Endocarditis, valve unspecified: Secondary | ICD-10-CM | POA: Diagnosis not present

## 2021-10-18 DIAGNOSIS — K635 Polyp of colon: Secondary | ICD-10-CM | POA: Diagnosis not present

## 2021-10-18 DIAGNOSIS — M79644 Pain in right finger(s): Secondary | ICD-10-CM | POA: Diagnosis not present

## 2021-10-18 DIAGNOSIS — I10 Essential (primary) hypertension: Secondary | ICD-10-CM | POA: Diagnosis not present

## 2021-10-22 DIAGNOSIS — M25641 Stiffness of right hand, not elsewhere classified: Secondary | ICD-10-CM | POA: Diagnosis not present

## 2021-10-30 DIAGNOSIS — M25641 Stiffness of right hand, not elsewhere classified: Secondary | ICD-10-CM | POA: Diagnosis not present

## 2021-10-31 ENCOUNTER — Other Ambulatory Visit (HOSPITAL_COMMUNITY): Payer: Self-pay

## 2021-11-11 DIAGNOSIS — M25641 Stiffness of right hand, not elsewhere classified: Secondary | ICD-10-CM | POA: Diagnosis not present

## 2021-11-18 DIAGNOSIS — Z4789 Encounter for other orthopedic aftercare: Secondary | ICD-10-CM | POA: Diagnosis not present

## 2021-11-18 DIAGNOSIS — M25641 Stiffness of right hand, not elsewhere classified: Secondary | ICD-10-CM | POA: Diagnosis not present

## 2021-12-05 ENCOUNTER — Encounter (INDEPENDENT_AMBULATORY_CARE_PROVIDER_SITE_OTHER): Payer: 59 | Admitting: Ophthalmology

## 2021-12-09 DIAGNOSIS — M25641 Stiffness of right hand, not elsewhere classified: Secondary | ICD-10-CM | POA: Diagnosis not present

## 2021-12-10 ENCOUNTER — Other Ambulatory Visit (HOSPITAL_COMMUNITY): Payer: Self-pay

## 2021-12-13 ENCOUNTER — Other Ambulatory Visit: Payer: Self-pay | Admitting: Obstetrics and Gynecology

## 2021-12-13 DIAGNOSIS — Z1231 Encounter for screening mammogram for malignant neoplasm of breast: Secondary | ICD-10-CM

## 2022-01-06 ENCOUNTER — Other Ambulatory Visit (HOSPITAL_COMMUNITY): Payer: Self-pay

## 2022-01-08 ENCOUNTER — Other Ambulatory Visit (HOSPITAL_COMMUNITY): Payer: Self-pay

## 2022-01-16 ENCOUNTER — Ambulatory Visit: Payer: 59

## 2022-01-21 IMAGING — MG DIGITAL SCREENING BILAT W/ TOMO W/ CAD
8 series · 8 of 24 positions shown · non-contrast
Comparison: Previous exam(s).

CLINICAL DATA: Screening.

EXAM:
DIGITAL SCREENING BILATERAL MAMMOGRAM WITH TOMO AND CAD

[R MLO synth-2D]
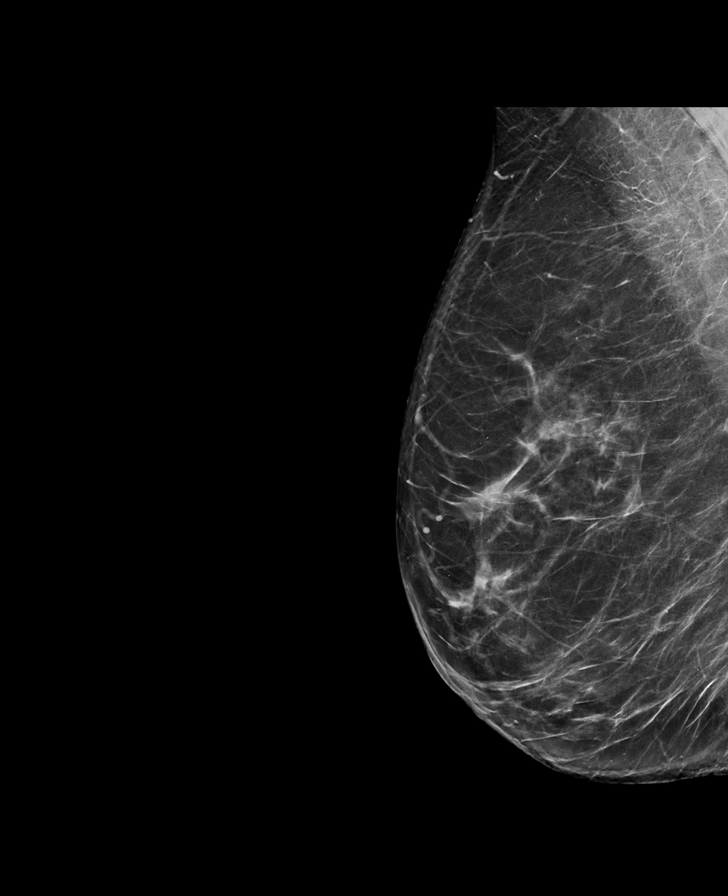

[L CC synth-2D]
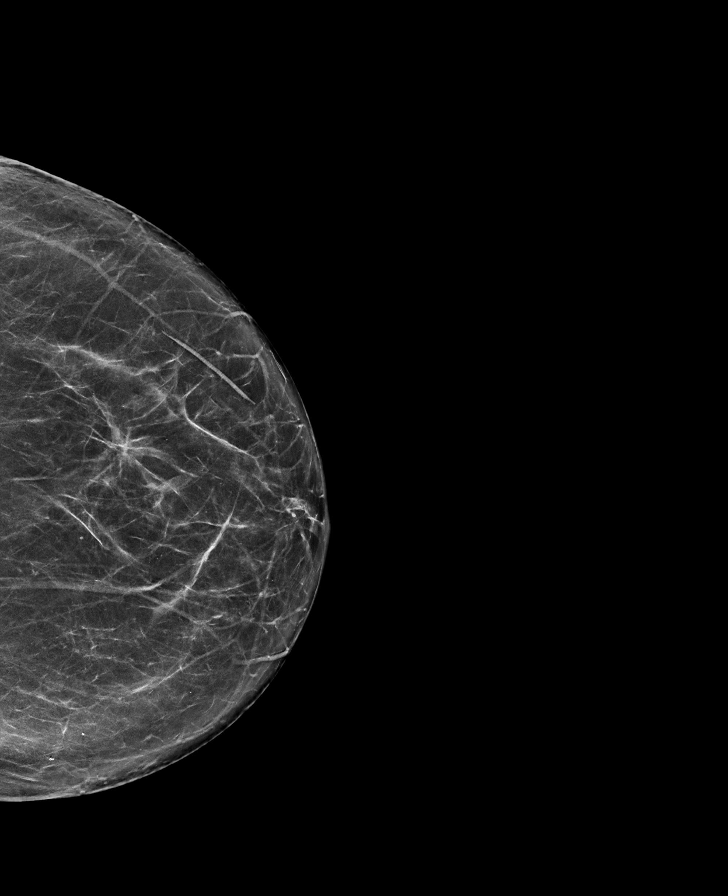

[R CC synth-2D]
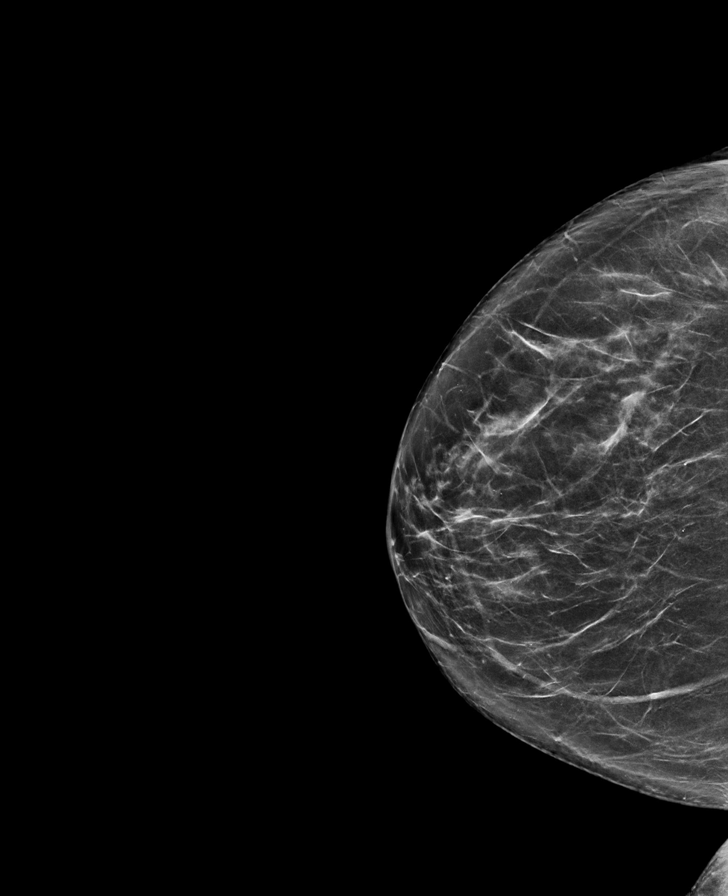

[L MLO synth-2D]
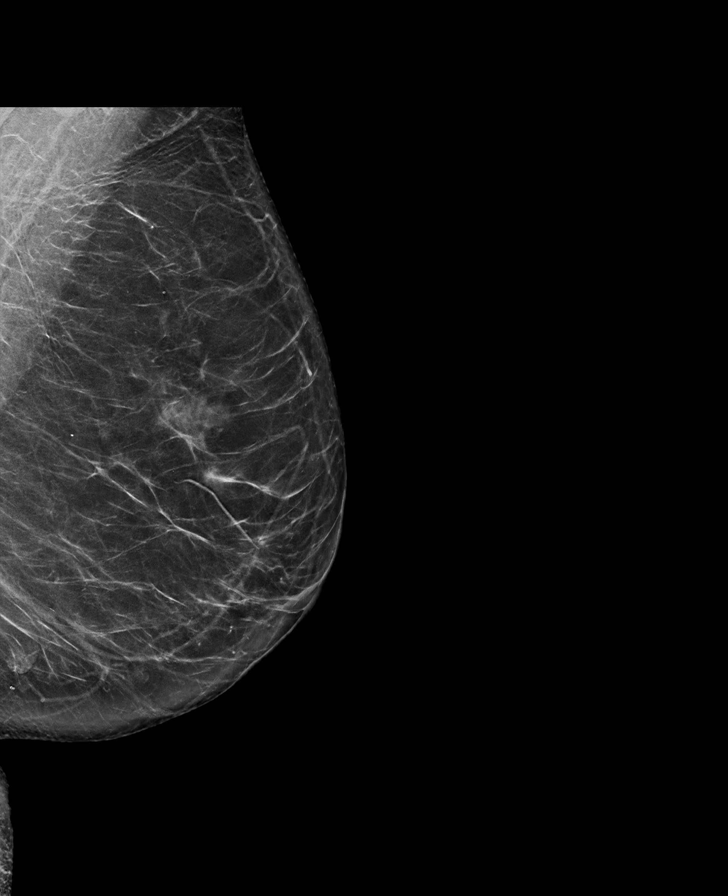

[R CC tomo · tomo slice 35/70.0]
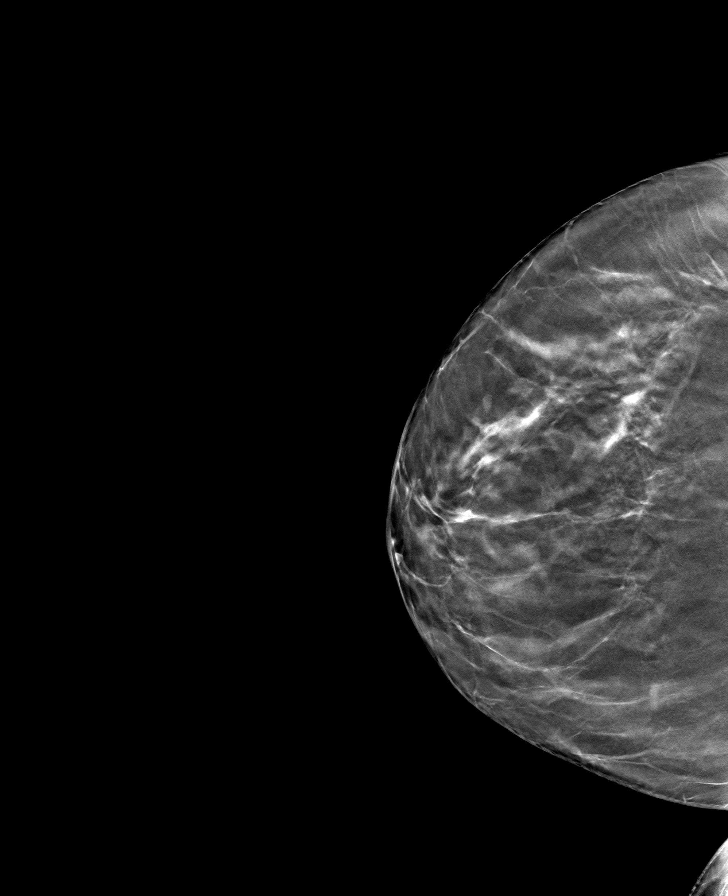

[L MLO tomo · tomo slice 39/77.0]
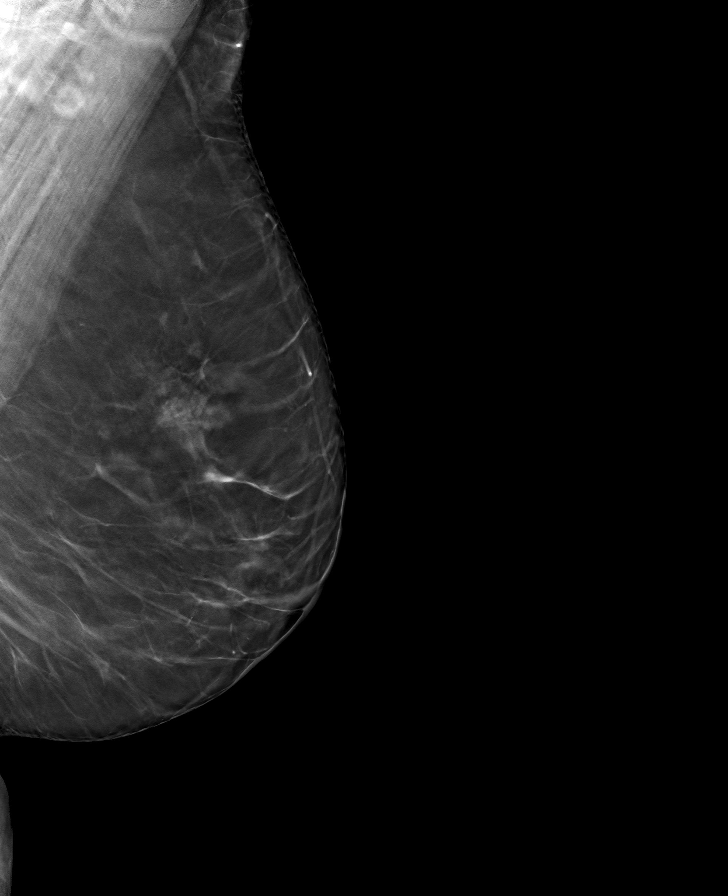

[R MLO tomo · tomo slice 39/76.0]
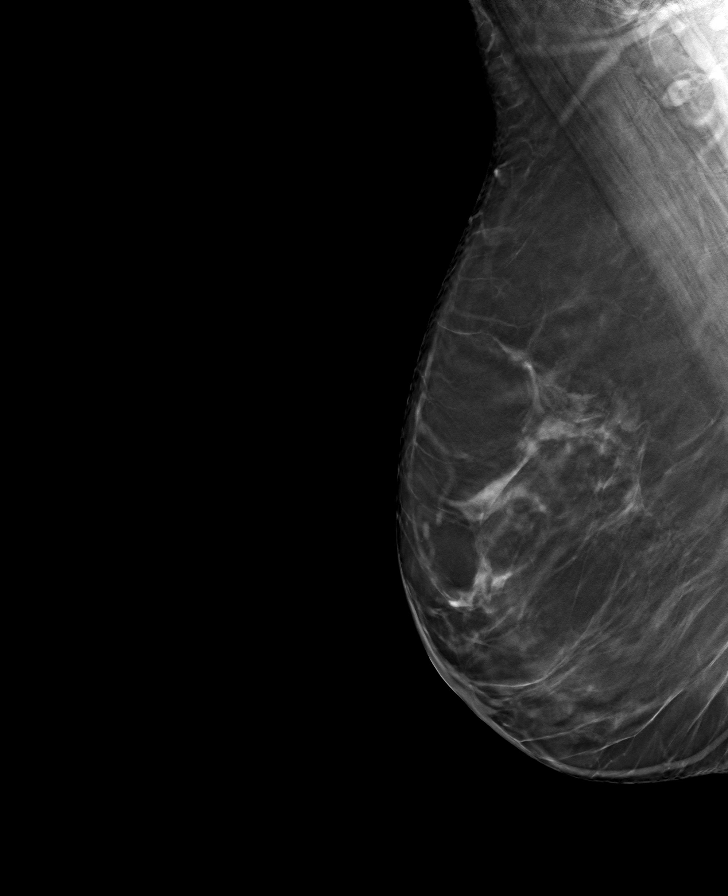

[L CC tomo · tomo slice 36/71.0]
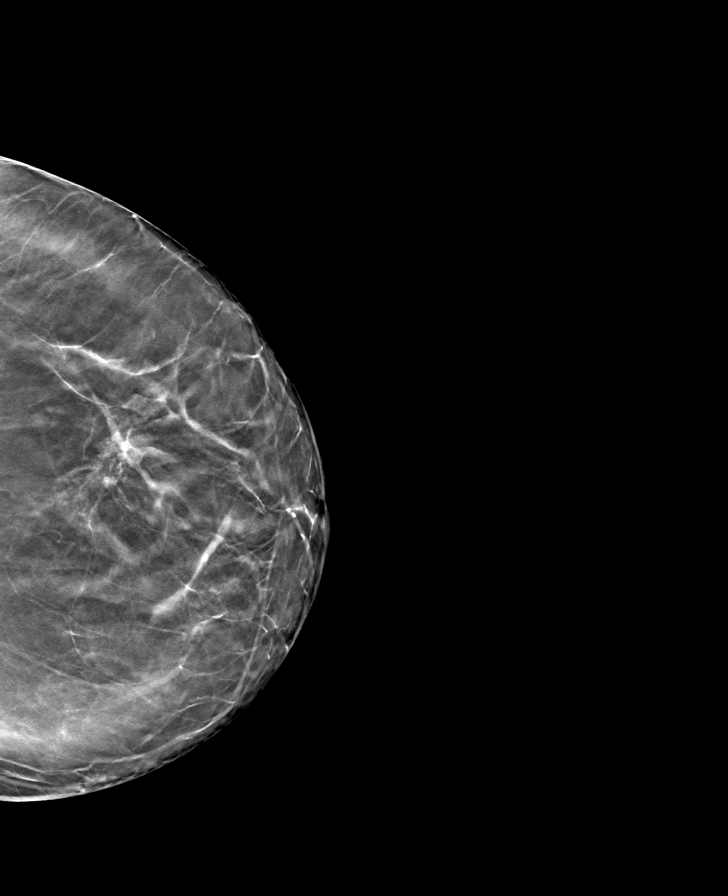

[8 of 24 positions shown; findings below may reference images not displayed]

ACR Breast Density Category b: There are scattered areas of
fibroglandular density.
FINDINGS: There are no findings suspicious for malignancy. Images were
processed with CAD.
IMPRESSION: No mammographic evidence of malignancy. A result letter of this
screening mammogram will be mailed directly to the patient.

RECOMMENDATION:
Screening mammogram in one year. (Code:[id number])

BI-RADS CATEGORY  1: Negative.

## 2022-02-03 ENCOUNTER — Other Ambulatory Visit (HOSPITAL_COMMUNITY): Payer: Self-pay

## 2022-02-03 MED ORDER — CYCLOSPORINE 0.05 % OP EMUL
1.0000 [drp] | Freq: Two times a day (BID) | OPHTHALMIC | 3 refills | Status: DC
  Filled 2022-02-03: qty 60, 30d supply, fill #0

## 2022-02-04 ENCOUNTER — Ambulatory Visit
Admission: RE | Admit: 2022-02-04 | Discharge: 2022-02-04 | Disposition: A | Discharge status: Home / Self Care | Payer: 59 | Source: Ambulatory Visit | Attending: Obstetrics and Gynecology | Admitting: Obstetrics and Gynecology

## 2022-02-04 DIAGNOSIS — Z1231 Encounter for screening mammogram for malignant neoplasm of breast: Secondary | ICD-10-CM

## 2022-02-05 ENCOUNTER — Other Ambulatory Visit (HOSPITAL_COMMUNITY): Payer: Self-pay

## 2022-02-06 ENCOUNTER — Other Ambulatory Visit (HOSPITAL_COMMUNITY): Payer: Self-pay

## 2022-02-06 MED ORDER — CYCLOSPORINE 0.05 % OP EMUL
1.0000 [drp] | Freq: Two times a day (BID) | OPHTHALMIC | 3 refills | Status: AC
  Filled 2022-02-06 – 2022-03-10 (×3): qty 180, 90d supply, fill #0
  Filled 2022-06-13: qty 180, 90d supply, fill #1
  Filled 2022-09-19: qty 180, 90d supply, fill #2

## 2022-02-07 ENCOUNTER — Other Ambulatory Visit (HOSPITAL_COMMUNITY): Payer: Self-pay

## 2022-02-17 ENCOUNTER — Other Ambulatory Visit (HOSPITAL_COMMUNITY): Payer: Self-pay

## 2022-03-10 ENCOUNTER — Other Ambulatory Visit (HOSPITAL_COMMUNITY): Payer: Self-pay

## 2022-03-16 ENCOUNTER — Ambulatory Visit (HOSPITAL_COMMUNITY): Payer: Self-pay

## 2022-03-17 ENCOUNTER — Other Ambulatory Visit (HOSPITAL_COMMUNITY): Payer: Self-pay

## 2022-03-17 DIAGNOSIS — J3489 Other specified disorders of nose and nasal sinuses: Secondary | ICD-10-CM | POA: Diagnosis not present

## 2022-03-17 DIAGNOSIS — J339 Nasal polyp, unspecified: Secondary | ICD-10-CM | POA: Diagnosis not present

## 2022-03-17 DIAGNOSIS — J329 Chronic sinusitis, unspecified: Secondary | ICD-10-CM | POA: Diagnosis not present

## 2022-03-17 MED ORDER — PREDNISONE 20 MG PO TABS
ORAL_TABLET | ORAL | 0 refills | Status: AC
  Filled 2022-03-17: qty 18, 9d supply, fill #0

## 2022-03-17 MED ORDER — AMOXICILLIN 500 MG PO CAPS
500.0000 mg | ORAL_CAPSULE | Freq: Three times a day (TID) | ORAL | 0 refills | Status: DC
  Filled 2022-03-17: qty 21, 7d supply, fill #0

## 2022-03-24 DIAGNOSIS — L309 Dermatitis, unspecified: Secondary | ICD-10-CM | POA: Diagnosis not present

## 2022-03-24 DIAGNOSIS — L570 Actinic keratosis: Secondary | ICD-10-CM | POA: Diagnosis not present

## 2022-04-02 ENCOUNTER — Other Ambulatory Visit (HOSPITAL_COMMUNITY): Payer: Self-pay

## 2022-04-02 MED ORDER — KETOROLAC TROMETHAMINE 0.5 % OP SOLN
1.0000 [drp] | Freq: Two times a day (BID) | OPHTHALMIC | 6 refills | Status: DC
  Filled 2022-04-02: qty 5, 50d supply, fill #0

## 2022-04-02 MED ORDER — BROMFENAC SODIUM 0.07 % OP SOLN
1.0000 [drp] | Freq: Two times a day (BID) | OPHTHALMIC | 6 refills | Status: DC
  Filled 2022-04-02: qty 3, 20d supply, fill #0

## 2022-04-02 MED ORDER — PREDNISOLONE ACETATE 1 % OP SUSP
1.0000 [drp] | Freq: Two times a day (BID) | OPHTHALMIC | 6 refills | Status: DC
  Filled 2022-04-02: qty 5, 50d supply, fill #0
  Filled 2022-09-06: qty 5, 50d supply, fill #1
  Filled 2023-02-06: qty 5, 50d supply, fill #2

## 2022-04-02 MED ORDER — BRINZOLAMIDE-BRIMONIDINE 1-0.2 % OP SUSP
1.0000 [drp] | Freq: Two times a day (BID) | OPHTHALMIC | 6 refills | Status: DC
  Filled 2022-04-02: qty 8, 80d supply, fill #0

## 2022-04-02 MED ORDER — CYCLOSPORINE 0.05 % OP EMUL
1.0000 [drp] | Freq: Two times a day (BID) | OPHTHALMIC | 6 refills | Status: AC
  Filled 2022-04-02: qty 180, 90d supply, fill #0

## 2022-04-08 ENCOUNTER — Other Ambulatory Visit (HOSPITAL_COMMUNITY): Payer: Self-pay

## 2022-04-08 DIAGNOSIS — Z0000 Encounter for general adult medical examination without abnormal findings: Secondary | ICD-10-CM | POA: Diagnosis not present

## 2022-04-08 DIAGNOSIS — E785 Hyperlipidemia, unspecified: Secondary | ICD-10-CM | POA: Diagnosis not present

## 2022-04-08 DIAGNOSIS — E538 Deficiency of other specified B group vitamins: Secondary | ICD-10-CM | POA: Diagnosis not present

## 2022-04-08 DIAGNOSIS — R7989 Other specified abnormal findings of blood chemistry: Secondary | ICD-10-CM | POA: Diagnosis not present

## 2022-04-08 DIAGNOSIS — E039 Hypothyroidism, unspecified: Secondary | ICD-10-CM | POA: Diagnosis not present

## 2022-04-08 DIAGNOSIS — I10 Essential (primary) hypertension: Secondary | ICD-10-CM | POA: Diagnosis not present

## 2022-04-08 MED ORDER — SYNTHROID 100 MCG PO TABS
100.0000 ug | ORAL_TABLET | Freq: Every day | ORAL | 3 refills | Status: DC
Start: 2022-04-08 — End: 2023-04-02
  Filled 2022-04-08: qty 90, 90d supply, fill #0
  Filled 2022-07-07: qty 90, 90d supply, fill #1
  Filled 2022-10-02: qty 90, 90d supply, fill #2
  Filled 2022-12-31: qty 90, 90d supply, fill #3

## 2022-04-15 DIAGNOSIS — Z0000 Encounter for general adult medical examination without abnormal findings: Secondary | ICD-10-CM | POA: Diagnosis not present

## 2022-04-15 DIAGNOSIS — I700 Atherosclerosis of aorta: Secondary | ICD-10-CM | POA: Diagnosis not present

## 2022-04-15 DIAGNOSIS — E039 Hypothyroidism, unspecified: Secondary | ICD-10-CM | POA: Diagnosis not present

## 2022-04-15 DIAGNOSIS — Z1389 Encounter for screening for other disorder: Secondary | ICD-10-CM | POA: Diagnosis not present

## 2022-04-15 DIAGNOSIS — E785 Hyperlipidemia, unspecified: Secondary | ICD-10-CM | POA: Diagnosis not present

## 2022-04-15 DIAGNOSIS — I10 Essential (primary) hypertension: Secondary | ICD-10-CM | POA: Diagnosis not present

## 2022-04-15 DIAGNOSIS — R82998 Other abnormal findings in urine: Secondary | ICD-10-CM | POA: Diagnosis not present

## 2022-04-15 DIAGNOSIS — Z1331 Encounter for screening for depression: Secondary | ICD-10-CM | POA: Diagnosis not present

## 2022-04-15 DIAGNOSIS — R002 Palpitations: Secondary | ICD-10-CM | POA: Diagnosis not present

## 2022-04-15 DIAGNOSIS — E538 Deficiency of other specified B group vitamins: Secondary | ICD-10-CM | POA: Diagnosis not present

## 2022-05-14 DIAGNOSIS — Z1151 Encounter for screening for human papillomavirus (HPV): Secondary | ICD-10-CM | POA: Diagnosis not present

## 2022-05-14 DIAGNOSIS — Z6825 Body mass index (BMI) 25.0-25.9, adult: Secondary | ICD-10-CM | POA: Diagnosis not present

## 2022-05-14 DIAGNOSIS — Z01419 Encounter for gynecological examination (general) (routine) without abnormal findings: Secondary | ICD-10-CM | POA: Diagnosis not present

## 2022-05-14 DIAGNOSIS — Z124 Encounter for screening for malignant neoplasm of cervix: Secondary | ICD-10-CM | POA: Diagnosis not present

## 2022-05-20 ENCOUNTER — Other Ambulatory Visit: Payer: Self-pay | Admitting: Obstetrics and Gynecology

## 2022-05-20 DIAGNOSIS — N62 Hypertrophy of breast: Secondary | ICD-10-CM

## 2022-05-22 ENCOUNTER — Other Ambulatory Visit (HOSPITAL_BASED_OUTPATIENT_CLINIC_OR_DEPARTMENT_OTHER): Payer: Self-pay

## 2022-05-22 MED ORDER — INFLUENZA VAC A&B SA ADJ QUAD 0.5 ML IM PRSY
PREFILLED_SYRINGE | INTRAMUSCULAR | 0 refills | Status: DC
  Filled 2022-05-22: qty 0.5, 1d supply, fill #0

## 2022-05-22 MED ORDER — COMIRNATY 30 MCG/0.3ML IM SUSY
PREFILLED_SYRINGE | INTRAMUSCULAR | 0 refills | Status: DC
  Filled 2022-05-22: qty 0.3, 1d supply, fill #0

## 2022-06-04 ENCOUNTER — Other Ambulatory Visit (HOSPITAL_COMMUNITY): Payer: Self-pay

## 2022-06-04 ENCOUNTER — Encounter (INDEPENDENT_AMBULATORY_CARE_PROVIDER_SITE_OTHER): Payer: 59 | Admitting: Ophthalmology

## 2022-06-04 MED ORDER — ATORVASTATIN CALCIUM 10 MG PO TABS
10.0000 mg | ORAL_TABLET | Freq: Every day | ORAL | 3 refills | Status: DC
  Filled 2022-06-04: qty 90, 90d supply, fill #0
  Filled 2022-09-03: qty 90, 90d supply, fill #1
  Filled 2022-12-04: qty 90, 90d supply, fill #2
  Filled 2023-03-02: qty 90, 90d supply, fill #3

## 2022-06-04 MED ORDER — LOSARTAN POTASSIUM 25 MG PO TABS
25.0000 mg | ORAL_TABLET | Freq: Every day | ORAL | 4 refills | Status: DC
  Filled 2022-06-04: qty 90, 90d supply, fill #0
  Filled 2022-09-03: qty 90, 90d supply, fill #1
  Filled 2022-12-04: qty 90, 90d supply, fill #2
  Filled 2023-03-02: qty 90, 90d supply, fill #3

## 2022-06-05 ENCOUNTER — Encounter (INDEPENDENT_AMBULATORY_CARE_PROVIDER_SITE_OTHER): Payer: 59 | Admitting: Ophthalmology

## 2022-06-13 ENCOUNTER — Other Ambulatory Visit (HOSPITAL_COMMUNITY): Payer: Self-pay

## 2022-06-16 ENCOUNTER — Other Ambulatory Visit (HOSPITAL_COMMUNITY): Payer: Self-pay

## 2022-07-07 ENCOUNTER — Other Ambulatory Visit (HOSPITAL_COMMUNITY): Payer: Self-pay

## 2022-07-07 ENCOUNTER — Other Ambulatory Visit: Payer: Self-pay

## 2022-07-08 ENCOUNTER — Other Ambulatory Visit (HOSPITAL_COMMUNITY): Payer: Self-pay

## 2022-07-14 ENCOUNTER — Other Ambulatory Visit (HOSPITAL_COMMUNITY): Payer: Self-pay

## 2022-07-14 DIAGNOSIS — L821 Other seborrheic keratosis: Secondary | ICD-10-CM | POA: Diagnosis not present

## 2022-07-14 DIAGNOSIS — D692 Other nonthrombocytopenic purpura: Secondary | ICD-10-CM | POA: Diagnosis not present

## 2022-07-14 DIAGNOSIS — L309 Dermatitis, unspecified: Secondary | ICD-10-CM | POA: Diagnosis not present

## 2022-07-14 DIAGNOSIS — D225 Melanocytic nevi of trunk: Secondary | ICD-10-CM | POA: Diagnosis not present

## 2022-07-14 MED ORDER — FLUTICASONE PROPIONATE 0.005 % EX OINT
TOPICAL_OINTMENT | CUTANEOUS | 0 refills | Status: DC
Start: 2022-07-14 — End: 2023-04-21
  Filled 2022-07-14: qty 15, 7d supply, fill #0

## 2022-07-15 ENCOUNTER — Other Ambulatory Visit (HOSPITAL_COMMUNITY): Payer: Self-pay

## 2022-07-15 MED ORDER — LOTEPREDNOL ETABONATE 0.5 % OP SUSP
OPHTHALMIC | 3 refills | Status: DC
  Filled 2022-07-15 (×2): qty 5, 30d supply, fill #0
  Filled 2022-07-15: qty 5, 50d supply, fill #0
  Filled 2022-10-22 – 2022-10-24 (×3): qty 5, 30d supply, fill #1

## 2022-07-16 ENCOUNTER — Other Ambulatory Visit (HOSPITAL_COMMUNITY): Payer: Self-pay

## 2022-07-17 ENCOUNTER — Other Ambulatory Visit (HOSPITAL_COMMUNITY): Payer: Self-pay

## 2022-08-05 ENCOUNTER — Other Ambulatory Visit: Payer: 59

## 2022-08-20 ENCOUNTER — Ambulatory Visit
Admission: RE | Admit: 2022-08-20 | Discharge: 2022-08-20 | Disposition: A | Discharge status: Home / Self Care | Payer: Commercial Managed Care - PPO | Source: Ambulatory Visit | Attending: Obstetrics and Gynecology | Admitting: Obstetrics and Gynecology

## 2022-08-20 DIAGNOSIS — N6489 Other specified disorders of breast: Secondary | ICD-10-CM | POA: Diagnosis not present

## 2022-08-20 DIAGNOSIS — N62 Hypertrophy of breast: Secondary | ICD-10-CM

## 2022-08-20 MED ORDER — GADOPICLENOL 0.5 MMOL/ML IV SOLN
7.0000 mL | Freq: Once | INTRAVENOUS | Status: AC | PRN
  Administered 2022-08-20: 7 mL via INTRAVENOUS

## 2022-10-02 ENCOUNTER — Other Ambulatory Visit (HOSPITAL_COMMUNITY): Payer: Self-pay

## 2022-10-20 DIAGNOSIS — I10 Essential (primary) hypertension: Secondary | ICD-10-CM | POA: Diagnosis not present

## 2022-10-20 DIAGNOSIS — E785 Hyperlipidemia, unspecified: Secondary | ICD-10-CM | POA: Diagnosis not present

## 2022-10-20 DIAGNOSIS — E039 Hypothyroidism, unspecified: Secondary | ICD-10-CM | POA: Diagnosis not present

## 2022-10-20 DIAGNOSIS — I700 Atherosclerosis of aorta: Secondary | ICD-10-CM | POA: Diagnosis not present

## 2022-10-20 DIAGNOSIS — K635 Polyp of colon: Secondary | ICD-10-CM | POA: Diagnosis not present

## 2022-10-20 DIAGNOSIS — D649 Anemia, unspecified: Secondary | ICD-10-CM | POA: Diagnosis not present

## 2022-10-20 DIAGNOSIS — I38 Endocarditis, valve unspecified: Secondary | ICD-10-CM | POA: Diagnosis not present

## 2022-10-20 DIAGNOSIS — E538 Deficiency of other specified B group vitamins: Secondary | ICD-10-CM | POA: Diagnosis not present

## 2022-10-20 DIAGNOSIS — D1803 Hemangioma of intra-abdominal structures: Secondary | ICD-10-CM | POA: Diagnosis not present

## 2022-10-20 DIAGNOSIS — R002 Palpitations: Secondary | ICD-10-CM | POA: Diagnosis not present

## 2022-10-22 ENCOUNTER — Other Ambulatory Visit (HOSPITAL_COMMUNITY): Payer: Self-pay

## 2022-10-22 ENCOUNTER — Other Ambulatory Visit: Payer: Self-pay

## 2022-10-24 ENCOUNTER — Other Ambulatory Visit (HOSPITAL_COMMUNITY): Payer: Self-pay

## 2022-10-27 ENCOUNTER — Other Ambulatory Visit (HOSPITAL_COMMUNITY): Payer: Self-pay

## 2022-12-04 ENCOUNTER — Encounter (INDEPENDENT_AMBULATORY_CARE_PROVIDER_SITE_OTHER): Payer: Self-pay | Admitting: Ophthalmology

## 2022-12-04 ENCOUNTER — Encounter (INDEPENDENT_AMBULATORY_CARE_PROVIDER_SITE_OTHER): Payer: Commercial Managed Care - PPO | Admitting: Ophthalmology

## 2022-12-04 ENCOUNTER — Encounter (INDEPENDENT_AMBULATORY_CARE_PROVIDER_SITE_OTHER): Payer: Self-pay

## 2022-12-05 ENCOUNTER — Other Ambulatory Visit (HOSPITAL_COMMUNITY): Payer: Self-pay

## 2022-12-23 ENCOUNTER — Other Ambulatory Visit (HOSPITAL_COMMUNITY): Payer: Self-pay | Admitting: Endocrinology

## 2022-12-23 DIAGNOSIS — R1030 Lower abdominal pain, unspecified: Secondary | ICD-10-CM | POA: Diagnosis not present

## 2022-12-23 DIAGNOSIS — D1803 Hemangioma of intra-abdominal structures: Secondary | ICD-10-CM | POA: Diagnosis not present

## 2022-12-24 ENCOUNTER — Ambulatory Visit (HOSPITAL_COMMUNITY)
Admission: RE | Admit: 2022-12-24 | Discharge: 2022-12-24 | Disposition: A | Discharge status: Home / Self Care | Payer: Commercial Managed Care - PPO | Source: Ambulatory Visit | Attending: Endocrinology | Admitting: Endocrinology

## 2022-12-24 DIAGNOSIS — K5730 Diverticulosis of large intestine without perforation or abscess without bleeding: Secondary | ICD-10-CM | POA: Diagnosis not present

## 2022-12-24 DIAGNOSIS — D224 Melanocytic nevi of scalp and neck: Secondary | ICD-10-CM | POA: Diagnosis not present

## 2022-12-24 DIAGNOSIS — R1030 Lower abdominal pain, unspecified: Secondary | ICD-10-CM | POA: Insufficient documentation

## 2022-12-24 DIAGNOSIS — L821 Other seborrheic keratosis: Secondary | ICD-10-CM | POA: Diagnosis not present

## 2022-12-24 DIAGNOSIS — L570 Actinic keratosis: Secondary | ICD-10-CM | POA: Diagnosis not present

## 2022-12-24 DIAGNOSIS — D225 Melanocytic nevi of trunk: Secondary | ICD-10-CM | POA: Diagnosis not present

## 2022-12-24 MED ORDER — SODIUM CHLORIDE (PF) 0.9 % IJ SOLN
INTRAMUSCULAR | Status: AC
  Filled 2022-12-24: qty 50

## 2022-12-24 MED ORDER — IOHEXOL 300 MG/ML  SOLN
100.0000 mL | Freq: Once | INTRAMUSCULAR | Status: AC | PRN
Start: 2022-12-24 — End: 2022-12-24
  Administered 2022-12-24: 100 mL via INTRAVENOUS

## 2022-12-31 ENCOUNTER — Other Ambulatory Visit: Payer: Self-pay

## 2022-12-31 ENCOUNTER — Other Ambulatory Visit (HOSPITAL_COMMUNITY): Payer: Self-pay

## 2023-01-01 ENCOUNTER — Other Ambulatory Visit (HOSPITAL_COMMUNITY): Payer: Self-pay

## 2023-02-06 ENCOUNTER — Other Ambulatory Visit: Payer: Self-pay | Admitting: Obstetrics and Gynecology

## 2023-02-06 ENCOUNTER — Other Ambulatory Visit (HOSPITAL_COMMUNITY): Payer: Self-pay

## 2023-02-06 DIAGNOSIS — Z1231 Encounter for screening mammogram for malignant neoplasm of breast: Secondary | ICD-10-CM

## 2023-02-09 ENCOUNTER — Ambulatory Visit
Admission: RE | Admit: 2023-02-09 | Discharge: 2023-02-09 | Disposition: A | Discharge status: Home / Self Care | Payer: Commercial Managed Care - PPO | Source: Ambulatory Visit | Attending: Obstetrics and Gynecology | Admitting: Obstetrics and Gynecology

## 2023-02-09 DIAGNOSIS — Z1231 Encounter for screening mammogram for malignant neoplasm of breast: Secondary | ICD-10-CM

## 2023-03-17 ENCOUNTER — Other Ambulatory Visit (HOSPITAL_COMMUNITY): Payer: Self-pay

## 2023-03-17 DIAGNOSIS — J339 Nasal polyp, unspecified: Secondary | ICD-10-CM | POA: Diagnosis not present

## 2023-03-17 MED ORDER — PREDNISONE 10 MG (21) PO TBPK
ORAL_TABLET | ORAL | 0 refills | Status: DC
  Filled 2023-03-17: qty 21, 6d supply, fill #0

## 2023-04-01 ENCOUNTER — Other Ambulatory Visit (HOSPITAL_COMMUNITY): Payer: Self-pay

## 2023-04-02 ENCOUNTER — Other Ambulatory Visit (HOSPITAL_COMMUNITY): Payer: Self-pay

## 2023-04-02 MED ORDER — SYNTHROID 100 MCG PO TABS
100.0000 ug | ORAL_TABLET | Freq: Every day | ORAL | 3 refills | Status: DC
  Filled 2023-04-02: qty 90, 90d supply, fill #0
  Filled 2023-07-01: qty 90, 90d supply, fill #1
  Filled 2023-10-01: qty 90, 90d supply, fill #2
  Filled 2023-12-28: qty 90, 90d supply, fill #3

## 2023-04-03 ENCOUNTER — Other Ambulatory Visit (HOSPITAL_COMMUNITY): Payer: Self-pay

## 2023-04-21 ENCOUNTER — Ambulatory Visit: Payer: Commercial Managed Care - PPO | Admitting: Allergy & Immunology

## 2023-04-21 ENCOUNTER — Encounter: Payer: Self-pay | Admitting: Allergy & Immunology

## 2023-04-21 ENCOUNTER — Other Ambulatory Visit (HOSPITAL_COMMUNITY): Payer: Self-pay

## 2023-04-21 ENCOUNTER — Other Ambulatory Visit: Payer: Self-pay

## 2023-04-21 VITALS — BP 100/78 | HR 79 | Temp 98.0°F | Ht 64.75 in | Wt 151.8 lb

## 2023-04-21 DIAGNOSIS — J339 Nasal polyp, unspecified: Secondary | ICD-10-CM | POA: Diagnosis not present

## 2023-04-21 DIAGNOSIS — J310 Chronic rhinitis: Secondary | ICD-10-CM

## 2023-04-21 MED ORDER — BUDESONIDE 0.5 MG/2ML IN SUSP
RESPIRATORY_TRACT | 5 refills | Status: DC
  Filled 2023-04-21: qty 60, 30d supply, fill #0
  Filled 2023-08-30: qty 60, 30d supply, fill #1
  Filled 2023-10-29: qty 60, 30d supply, fill #2
  Filled 2023-12-01: qty 60, 30d supply, fill #3
  Filled 2024-01-11: qty 60, 30d supply, fill #4
  Filled 2024-02-14: qty 60, 30d supply, fill #5
  Filled 2024-04-04: qty 60, 30d supply, fill #6

## 2023-04-21 NOTE — Progress Notes (Addendum)
NEW PATIENT  Date of Service/Encounter:  04/21/23  Consult requested by: Adrian Prince, MD   Assessment:   Chronic rhinitis - getting blood work today  Nasal polyps - left sided per rhinoscopy  Complicated past medical history including hypothyroidism, SVTs, and multiple melanocytic nevi  Plan/Recommendations:   1. Chronic rhinitis - We are going to get some labs to look for environmental allergies (this was one of Dr. Lucky Rathke questions). - I will not be surprised if this is negative given your history. - We will call you in 1-2 weeks with the results of the testing.   2. Nasal polyps - Information on Dupixent and nasal polyps provided. - The ocular symptoms are a rare side effect and typically seen more in patients with atopic dermatitis for what ever reason. - We can look into getting this approved, but you will probably need to try and fail other nasal steroids. - You have failed Flonase in the past, so let's start budesonide nasal rinses once daily into each nostril. - See recipe below. - Tammy is on [employer] and will reach out with more details on Dupixent.  3. Return in about 6 months (around 10/19/2023). You can have the follow up appointment with Dr. Dellis Anes or a Nurse Practicioner (our Nurse Practitioners are excellent and always have Physician oversight!).    This note in its entirety was forwarded to the Provider who requested this consultation.  Subjective:   Jessica Herrera is a 67 y.o. female presenting today for evaluation of  Chief Complaint  Patient presents with   Establish Care   Nasal Polyps    Wants to discuss dupixent.    Jessica Herrera has a history of the following: Patient Active Problem List   Diagnosis Date Noted   Essential hypertension 03/22/2021   Hyperlipidemia 03/22/2021   Dyspnea on exertion 03/22/2021   LLQ abdominal pain 02/18/2018   Achilles tendinitis, left leg 03/31/2017   Metatarsal stress  fracture, right, initial encounter 03/02/2017   Heart palpitations, with dizziness 04/07/2013   B12 DEFICIENCY 02/08/2010   LIVER HEMANGIOMA 01/31/2010   LIVER MASS 01/31/2010   HYPOTHYROIDISM 01/31/2010   DIVERTICULOSIS OF COLON 01/31/2010    History obtained from: chart review and patient.  Jessica Herrera was referred by Adrian Prince, MD.     Jessica Herrera is a 67 y.o. female presenting for an evaluation of polyps and environmental allergies .  The patient, with a history of nasal polyps, was first diagnosed approximately five years ago. Initial treatment with prednisone was successful, and she remained symptom-free until the previous summer. A second course of prednisone was administered, which again resolved the symptoms. However, the symptoms recurred this summer, prompting the consideration of Dupixent as a treatment option.   She sees Dr. Pollyann Kennedy.  Her last appointment was March 17, 2023.  At that time, she had an upper respiratory infection a couple weeks ago that resulted in difficulty breathing through the left side.  She was started on a brief course of prednisone and referred her to [place] for allergy testing or treatment with Dupixent. She did have a rhinoscopy this was done on August 20 and showed a large mass and polyps completely filling the left nasal cavity.  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 Dr. Tyrone Schimke is a retina doctor and manages her eyesight.    She also has a history of Meniere's disease, which was treated  approximately twenty years ago with a procedure to destroy the balance system in one ear. This treatment was successful, and she has not experienced vertigo since.  This was done in 2015 or so.  In addition to these conditions, the patient has a history of childhood asthma. However, the asthma was reported to have resolved as she grew older. The patient also  reports a peculiar symptom of sneezing approximately twelve times in a row almost daily, with no associated congestion.  The patient has no known environmental allergies, food allergies, or eczema. Jessica Herrera has not been on any allergy medications. The patient's nasal polyps were confirmed via nasal endoscopy. Jessica Herrera has not had any surgical interventions for the polyps.  Her PCP, Dr. Evlyn Kanner, manages her thyroid medication.  She has been on thyroid medicine since her college years.  She has no history of autoimmune disease.  She did have a history of SVT and saw Dr. Allyson Sabal and Dr. Lalla Brothers.  Testing was all negative.  She had a Holter monitor that was normal.  They think that this was all related to a possible sequelae of her COVID-19 infection.  She has a history of moles and gets checked annually with dermatology.  She sees Dr. Karlyn Agee at [facility] dermatology.  She had a few basal cell carcinomas removed, but otherwise no dermatologic skin surgeries.  Otherwise, there is no history of other atopic diseases, including food allergies, drug allergies, stinging insect allergies, or contact dermatitis. There is no significant infectious history. Vaccinations are up to date.    Past Medical History: Patient Active Problem List   Diagnosis Date Noted   Essential hypertension 03/22/2021   Hyperlipidemia 03/22/2021   Dyspnea on exertion 03/22/2021   LLQ abdominal pain 02/18/2018   Achilles tendinitis, left leg 03/31/2017   Metatarsal stress fracture, right, initial encounter 03/02/2017   Heart palpitations, with dizziness 04/07/2013   B12 DEFICIENCY 02/08/2010   LIVER HEMANGIOMA 01/31/2010   LIVER MASS 01/31/2010   HYPOTHYROIDISM 01/31/2010   DIVERTICULOSIS OF COLON 01/31/2010    Medication List:  Allergies as of 04/21/2023   No Known Allergies      Medication List        Accurate as of April 21, 2023  2:56 PM. If you have any questions, ask your nurse or doctor.          amoxicillin  500 MG capsule Commonly known as: AMOXIL Take 1 capsule (500 mg total) by mouth 3 (three) times daily for 7 days   atorvastatin 10 MG tablet Commonly known as: LIPITOR TAKE 1 TABLET BY MOUTH ONCE DAILY   atorvastatin 10 MG tablet Commonly known as: LIPITOR Take 1 tablet (10 mg total) by mouth daily.   brimonidine 0.2 % ophthalmic solution Commonly known as: ALPHAGAN Place 1 drop into the left eye 2 (two) times daily.   budesonide 0.5 MG/2ML nebulizer solution Commonly known as: Pulmicort Mixed with nasal saline and instill into each nostril once daily. Started by: Alfonse Spruce   calcium carbonate 500 MG chewable tablet Commonly known as: TUMS - dosed in mg elemental calcium Chew 1 tablet by mouth daily. Three daily   cephALEXin 500 MG capsule Commonly known as: Keflex Take 1 capsule (500 mg total) by mouth 4 (four) times daily for 5 days.   Comirnaty syringe Generic drug: COVID-19 mRNA vaccine (Pfizer) Inject into the muscle.   Fluad Quadrivalent 0.5 ML injection Generic drug: influenza vaccine adjuvanted Inject into the muscle.   fluticasone 0.005 % ointment  Commonly known as: CUTIVATE Apply as directed to affected area twice a day during flares until clear.   ibuprofen 200 MG tablet Commonly known as: ADVIL Take 200 mg by mouth every 6 (six) hours as needed for pain.   ketorolac 0.5 % ophthalmic solution Commonly known as: ACULAR Place 1 drop into the left eye 2 (two) times daily.   losartan 25 MG tablet Commonly known as: COZAAR Take 1 tablet (25 mg total) by mouth daily for blood pressure   Lotemax SM 0.38 % Gel Generic drug: Loteprednol Etabonate Place 1 drop into the left eye 2 (two) times daily.   Lotemax SM 0.38 % Gel Generic drug: Loteprednol Etabonate Place 1 drop into the left eye 2 (two) times daily as directed   Lotemax 0.5 % ophthalmic suspension Generic drug: loteprednol Use as directed in affected eye   meloxicam 7.5 MG  tablet Commonly known as: MOBIC Take 1 tablet by mouth once a day   methocarbamol 500 MG tablet Commonly known as: ROBAXIN Take 1 tablet (500 mg total) by mouth every 6-8 hours as needed for muscle spasms   methylPREDNISolone 4 MG Tbpk tablet Commonly known as: Medrol Take as directed   ondansetron 8 MG disintegrating tablet Commonly known as: ZOFRAN-ODT Take 1 tablet (8 mg total) by mouth every 8 (eight) hours as needed for nausea.   oxyCODONE 5 MG immediate release tablet Commonly known as: Oxy IR/ROXICODONE Take 1 tablet (5 mg total) by mouth every 4-6 hours as needed for pain   Pfizer COVID-19 Vac Bivalent injection Generic drug: COVID-19 mRNA bivalent vaccine (Pfizer) Inject into the muscle.   prednisoLONE acetate 1 % ophthalmic suspension Commonly known as: PRED FORTE Place 1 drop into the left eye 2 (two) times daily.   predniSONE 10 MG (21) Tbpk tablet Commonly known as: STERAPRED UNI-PAK 21 TAB Use As Directed On Package.   Prolensa 0.07 % Soln Generic drug: Bromfenac Sodium Prolensa 0.07 % eye drops  PLACE 1 DROP IN AFFECTED EYE TWICE A DAY   Restasis 0.05 % ophthalmic emulsion Generic drug: cycloSPORINE Instill 1 drop into both eyes twice daily   cycloSPORINE 0.05 % ophthalmic emulsion Commonly known as: RESTASIS Place 1 drop into both eyes 2 (two) times daily.   Restasis 0.05 % ophthalmic emulsion Generic drug: cycloSPORINE Place 1 drop into both eyes 2 (two) times daily.   cycloSPORINE 0.05 % ophthalmic emulsion Commonly known as: RESTASIS Place 1 drop into both eyes 2 (two) times daily.   Simbrinza 1-0.2 % Susp Generic drug: Brinzolamide-Brimonidine Instill 1 drop into affected eye 2 (two) times daily.   Brinzolamide-Brimonidine 1-0.2 % Susp Instill 1 drop into affected eye 2 (two) times daily.   Synthroid 100 MCG tablet Generic drug: levothyroxine Take 1 tablet (100 mcg total) by mouth daily.   traMADol 50 MG tablet Commonly known as:  ULTRAM Take 1 tablet (50 mg total) by mouth every 4-6 hours        Birth History: non-contributory  Developmental History: non-contributory  Past Surgical History: Past Surgical History:  Procedure Laterality Date   BREAST BIOPSY  2012   left   BREAST EXCISIONAL BIOPSY Left 07/2010   CATARACT EXTRACTION     CESAREAN SECTION     COLONOSCOPY     2018   detached retina repair  2014   FOOT SURGERY     right   LAPAROSCOPIC OOPHERECTOMY Left      Family History: Family History  Problem Relation Age of Onset  Cancer Mother        uterine   Breast cancer Mother    Allergic rhinitis Father    Other Father         heart bypass    Heart disease Father    Colon cancer Father        pt undergoing questionable colon cancer workup at time of death   Colon polyps Neg Hx    Esophageal cancer Neg Hx    Rectal cancer Neg Hx      Social History: Philomina lives at home with her husband who is 41 years old and is a Teaching laboratory technician and still works..  She lives in a house that was built in [place].  There is carpeting in the main living areas and hardwood in the bedrooms.  They have gas heating and central cooling.  There are no animals inside or outside of the home.  There are no dust mite covers on the bedding.  There is no tobacco exposure.  She currently is a housewife.  She denies any fume, chemical, or dust exposure.  She does not live near an interstate or industrial area.  They have a lake house at [facility] as well as a [employer] in [place].  They went to [place] at least once a month.  They have 3 children and 6 grandchildren.  Once that is in [place], another set is in [place], and the other set is in [place].   Review of systems otherwise negative other than that mentioned in the HPI.    Objective:   Blood pressure 100/78, pulse 79, temperature 98 F (36.7 C), height 5' 4.75" (1.645 m), weight 151 lb 12.8 oz (68.9 kg), SpO2 98%. Body mass index is  25.46 kg/m.     Physical Exam Vitals reviewed.  Constitutional:      Appearance: She is well-developed.     Comments: Lovely.  Pleasant.  HENT:     Head: Normocephalic and atraumatic.     Right Ear: Tympanic membrane, ear canal and external ear normal. No drainage, swelling or tenderness. Tympanic membrane is not injected, scarred, erythematous, retracted or bulging.     Left Ear: Tympanic membrane, ear canal and external ear normal. No drainage, swelling or tenderness. Tympanic membrane is not injected, scarred, erythematous, retracted or bulging.     Nose: No nasal deformity, septal deviation, mucosal edema or rhinorrhea.     Right Turbinates: Enlarged, swollen and pale.     Left Turbinates: Enlarged, swollen and pale.     Right Sinus: No maxillary sinus tenderness or frontal sinus tenderness.     Left Sinus: No maxillary sinus tenderness or frontal sinus tenderness.     Comments: Enlarged turbinates bilaterally.  Scant polyps on the left side.    Mouth/Throat:     Mouth: Mucous membranes are not pale and not dry.     Pharynx: Uvula midline.  Eyes:     General: Lids are normal. Allergic shiner present.        Right eye: No discharge or hordeolum.        Left eye: No discharge.     Conjunctiva/sclera: Conjunctivae normal.     Right eye: Right conjunctiva is not injected. No chemosis.    Left eye: Left conjunctiva is not injected. No chemosis.    Pupils: Pupils are equal, round, and reactive to light.  Cardiovascular:     Rate and Rhythm: Normal rate and regular rhythm.     Heart sounds:  Normal heart sounds.  Pulmonary:     Effort: Pulmonary effort is normal. No tachypnea, accessory muscle usage or respiratory distress.     Breath sounds: Normal breath sounds. No wheezing, rhonchi or rales.  Chest:     Chest wall: No tenderness.  Abdominal:     Tenderness: There is no abdominal tenderness. There is no guarding or rebound.  Lymphadenopathy:     Head:     Right side of head:  No submandibular, tonsillar or occipital adenopathy.     Left side of head: No submandibular, tonsillar or occipital adenopathy.     Cervical: No cervical adenopathy.  Skin:    Coloration: Skin is not pale.     Findings: No abrasion, erythema, petechiae or rash. Rash is not papular, urticarial or vesicular.  Neurological:     Mental Status: She is alert.  Psychiatric:        Behavior: Behavior is cooperative.      Diagnostic studies: labs sent instead         Malachi Bonds, MD [facility]

## 2023-04-21 NOTE — Patient Instructions (Addendum)
1. Chronic rhinitis - We are going to get some labs to look for environmental allergies (this was one of Dr. Lucky Rathke questions). - I will not be surprised if this is negative given your history. - We will call you in 1-2 weeks with the results of the testing.   2. Nasal polyps - Information on Dupixent and nasal polyps provided. - The ocular symptoms are a rare side effect and typically seen more in patients with atopic dermatitis for what ever reason. - We can look into getting this approved, but you will probably need to try and fail other nasal steroids. - You have failed Flonase in the past, so let's start budesonide nasal rinses once daily into each nostril. - See recipe below. - Tammy is on [employer] and will reach out with more details on Dupixent.  3. Return in about 6 months (around 10/19/2023). You can have the follow up appointment with Dr. Dellis Anes or a Nurse Practicioner (our Nurse Practitioners are excellent and always have Physician oversight!).    Please inform us of any Emergency Department visits, hospitalizations, or changes in symptoms. Call us before going to the ED for breathing or allergy symptoms since we might be able to fit you in for a sick visit. Feel free to contact us anytime with any questions, problems, or concerns.  It was a pleasure to meet you today!  Websites that have reliable patient information: 1. American Academy of Asthma, Allergy, and Immunology: www.aaaai.org 2. Food Allergy Research and Education (FARE): foodallergy.org 3. Mothers of Asthmatics: http://www.asthmacommunitynetwork.org 4. American College of Allergy, Asthma, and Immunology: www.acaai.org   COVID-19 Vaccine Information can be found at: [email or web address].[email or web address] For questions related to vaccine distribution or appointments, please email [email or web address] or call [phone].     "Like" [place] on Facebook and  Instagram for our latest updates!      A healthy democracy works best when [employer] participate! Make sure you are registered to vote! If you have moved or changed any of your contact information, you will need to get this updated before voting! Scan the QR codes below to learn more!      Budesonide (Pulmicort) + Saline Irrigation/Rinse   Budesonide (Pulmicort) is an anti-inflammatory steroid medication used to decrease nasal and sinus inflammation. It is dispensed in liquid form in a vial. Although it is manufactured for use with a nebulizer, we intend for you to use it with the NeilMed Sinus Rinse bottle (preferred) or a Neti pot.    Instructions:  1) Make 240cc of saline in the NeilMed bottle using the salt packets or your own saline recipe (see separate handout).  2) Add the entire 2cc vial of liquid Budesonide (Pulmicort) to the rinse bottle and mix together.  3) While in the shower or over the sink, tilt your head forward to a comfortable level. Put the tip of the sinus rinse bottle in your nostril and aim it towards the crown or top of your head. Gently squeeze the bottle to flush out your nose. The fluid will circulate in and out of your sinus cavities, coming back out from either nostril or through your mouth. Try not to swallow large quantities and spit it out instead.  4) Perform Budesonide (Pulmicort) + Saline irrigations 2 times daily.

## 2023-04-23 LAB — ALLERGEN PROFILE, MOLD
Aureobasidi Pullulans IgE: <0.1 kU/L
Candida Albicans IgE: 0.14 kU/L — AB
M009-IgE Fusarium proliferatum: <0.1 kU/L
M014-IgE Epicoccum purpur: <0.1 kU/L
Mucor Racemosus IgE: <0.1 kU/L
Phoma Betae IgE: <0.1 kU/L
Setomelanomma Rostrat: <0.1 kU/L
Stemphylium Herbarum IgE: <0.1 kU/L

## 2023-04-23 LAB — ALLERGENS W/COMP RFLX AREA 2
Alternaria Alternata IgE: <0.1 kU/L
Aspergillus Fumigatus IgE: <0.1 kU/L
Bermuda Grass IgE: <0.1 kU/L
Cedar, Mountain IgE: <0.1 kU/L
Cladosporium Herbarum IgE: <0.1 kU/L
Cockroach, German IgE: <0.1 kU/L
Common Silver Birch IgE: <0.1 kU/L
Cottonwood IgE: <0.1 kU/L
D Farinae IgE: <0.1 kU/L
D Pteronyssinus IgE: <0.1 kU/L
E001-IgE Cat Dander: <0.1 kU/L
E005-IgE Dog Dander: <0.1 kU/L
Elm, American IgE: <0.1 kU/L
IgE (Immunoglobulin E), Serum: 21 IU/mL (ref 6–495)
Johnson Grass IgE: <0.1 kU/L
Maple/Box Elder IgE: <0.1 kU/L
Mouse Urine IgE: <0.1 kU/L
Oak, White IgE: <0.1 kU/L
Pecan, Hickory IgE: <0.1 kU/L
Penicillium Chrysogen IgE: <0.1 kU/L
Pigweed, Rough IgE: <0.1 kU/L
Ragweed, Short IgE: <0.1 kU/L
Sheep Sorrel IgE Qn: <0.1 kU/L
Timothy Grass IgE: <0.1 kU/L
White Mulberry IgE: <0.1 kU/L

## 2023-04-27 ENCOUNTER — Telehealth: Payer: Self-pay | Admitting: *Deleted

## 2023-04-27 NOTE — Telephone Encounter (Signed)
-----   Message from Alfonse Spruce sent at 04/21/2023  2:59 PM EDT ----- Interested in Dupixent for nasal polyps, but she has only been on Flonase.  Prednisone once per year.  I started budesonide rinses today.

## 2023-04-27 NOTE — Telephone Encounter (Signed)
Called patient and discussed Dupixent with patient she wants to try the rinses first and I advised she can reach out to me if she decides to start and I already have approval for same

## 2023-04-27 NOTE — Telephone Encounter (Signed)
YOU GOT APPROVAL? I am floored.   Malachi Bonds, MD [facility]

## 2023-04-28 DIAGNOSIS — E538 Deficiency of other specified B group vitamins: Secondary | ICD-10-CM | POA: Diagnosis not present

## 2023-04-28 DIAGNOSIS — Z1212 Encounter for screening for malignant neoplasm of rectum: Secondary | ICD-10-CM | POA: Diagnosis not present

## 2023-04-28 DIAGNOSIS — E611 Iron deficiency: Secondary | ICD-10-CM | POA: Diagnosis not present

## 2023-05-04 DIAGNOSIS — R002 Palpitations: Secondary | ICD-10-CM | POA: Diagnosis not present

## 2023-05-04 DIAGNOSIS — Z1339 Encounter for screening examination for other mental health and behavioral disorders: Secondary | ICD-10-CM | POA: Diagnosis not present

## 2023-05-04 DIAGNOSIS — Z0000 Encounter for general adult medical examination without abnormal findings: Secondary | ICD-10-CM | POA: Diagnosis not present

## 2023-05-04 DIAGNOSIS — I10 Essential (primary) hypertension: Secondary | ICD-10-CM | POA: Diagnosis not present

## 2023-05-04 DIAGNOSIS — I700 Atherosclerosis of aorta: Secondary | ICD-10-CM | POA: Diagnosis not present

## 2023-05-04 DIAGNOSIS — R82998 Other abnormal findings in urine: Secondary | ICD-10-CM | POA: Diagnosis not present

## 2023-05-04 DIAGNOSIS — I38 Endocarditis, valve unspecified: Secondary | ICD-10-CM | POA: Diagnosis not present

## 2023-05-04 DIAGNOSIS — E039 Hypothyroidism, unspecified: Secondary | ICD-10-CM | POA: Diagnosis not present

## 2023-05-04 DIAGNOSIS — Z1331 Encounter for screening for depression: Secondary | ICD-10-CM | POA: Diagnosis not present

## 2023-05-04 DIAGNOSIS — R1030 Lower abdominal pain, unspecified: Secondary | ICD-10-CM | POA: Diagnosis not present

## 2023-05-04 DIAGNOSIS — E538 Deficiency of other specified B group vitamins: Secondary | ICD-10-CM | POA: Diagnosis not present

## 2023-05-04 DIAGNOSIS — D1803 Hemangioma of intra-abdominal structures: Secondary | ICD-10-CM | POA: Diagnosis not present

## 2023-05-04 DIAGNOSIS — E785 Hyperlipidemia, unspecified: Secondary | ICD-10-CM | POA: Diagnosis not present

## 2023-05-12 ENCOUNTER — Other Ambulatory Visit (HOSPITAL_COMMUNITY): Payer: Self-pay

## 2023-05-19 DIAGNOSIS — Z124 Encounter for screening for malignant neoplasm of cervix: Secondary | ICD-10-CM | POA: Diagnosis not present

## 2023-05-19 DIAGNOSIS — Z6825 Body mass index (BMI) 25.0-25.9, adult: Secondary | ICD-10-CM | POA: Diagnosis not present

## 2023-05-19 DIAGNOSIS — Z01419 Encounter for gynecological examination (general) (routine) without abnormal findings: Secondary | ICD-10-CM | POA: Diagnosis not present

## 2023-05-19 DIAGNOSIS — Z1151 Encounter for screening for human papillomavirus (HPV): Secondary | ICD-10-CM | POA: Diagnosis not present

## 2023-05-21 ENCOUNTER — Other Ambulatory Visit (HOSPITAL_COMMUNITY): Payer: Self-pay

## 2023-05-21 MED ORDER — PREDNISOLONE ACETATE 1 % OP SUSP
1.0000 [drp] | Freq: Two times a day (BID) | OPHTHALMIC | 4 refills | Status: AC
  Filled 2023-05-21: qty 5, 7d supply, fill #0
  Filled 2023-07-14: qty 5, 7d supply, fill #1
  Filled 2023-08-12: qty 5, 7d supply, fill #2
  Filled 2023-09-23: qty 5, 7d supply, fill #3
  Filled 2023-10-29: qty 5, 7d supply, fill #4

## 2023-05-22 ENCOUNTER — Other Ambulatory Visit (HOSPITAL_COMMUNITY): Payer: Self-pay

## 2023-06-01 ENCOUNTER — Other Ambulatory Visit (HOSPITAL_COMMUNITY): Payer: Self-pay

## 2023-06-01 MED ORDER — ATORVASTATIN CALCIUM 10 MG PO TABS
10.0000 mg | ORAL_TABLET | Freq: Every day | ORAL | 4 refills | Status: DC
  Filled 2023-06-01: qty 90, 90d supply, fill #0
  Filled 2023-09-01: qty 90, 90d supply, fill #1
  Filled 2023-12-01: qty 90, 90d supply, fill #2
  Filled 2024-03-02: qty 90, 90d supply, fill #3
  Filled 2024-05-24: qty 90, 90d supply, fill #4

## 2023-06-10 ENCOUNTER — Other Ambulatory Visit (HOSPITAL_COMMUNITY): Payer: Self-pay

## 2023-06-10 ENCOUNTER — Other Ambulatory Visit: Payer: Self-pay

## 2023-06-10 MED ORDER — LOSARTAN POTASSIUM 25 MG PO TABS
25.0000 mg | ORAL_TABLET | Freq: Every day | ORAL | 4 refills | Status: DC
  Filled 2023-06-10: qty 90, 90d supply, fill #0
  Filled 2023-09-01: qty 90, 90d supply, fill #1
  Filled 2023-12-03: qty 90, 90d supply, fill #2
  Filled 2024-03-02: qty 90, 90d supply, fill #3
  Filled 2024-05-24: qty 90, 90d supply, fill #4

## 2023-06-18 ENCOUNTER — Other Ambulatory Visit (HOSPITAL_COMMUNITY): Payer: Self-pay

## 2023-06-18 MED ORDER — CYCLOSPORINE 0.05 % OP EMUL
1.0000 [drp] | Freq: Two times a day (BID) | OPHTHALMIC | 3 refills | Status: AC
  Filled 2023-06-18: qty 180, 90d supply, fill #0
  Filled 2023-09-23: qty 180, 90d supply, fill #1
  Filled 2024-04-04 – 2024-04-05 (×2): qty 180, 90d supply, fill #2

## 2023-06-30 ENCOUNTER — Other Ambulatory Visit (HOSPITAL_COMMUNITY): Payer: Self-pay

## 2023-06-30 MED ORDER — ALBUTEROL SULFATE HFA 108 (90 BASE) MCG/ACT IN AERS
1.0000 | INHALATION_SPRAY | RESPIRATORY_TRACT | 1 refills | Status: AC | PRN
Start: 2023-06-30 — End: ?
  Filled 2023-06-30: qty 6.7, 30d supply, fill #0

## 2023-06-30 MED ORDER — AZITHROMYCIN 250 MG PO TABS
ORAL_TABLET | ORAL | 0 refills | Status: AC
  Filled 2023-06-30: qty 6, 5d supply, fill #0

## 2023-07-01 ENCOUNTER — Other Ambulatory Visit (HOSPITAL_COMMUNITY): Payer: Self-pay

## 2023-07-02 ENCOUNTER — Other Ambulatory Visit (HOSPITAL_COMMUNITY): Payer: Self-pay

## 2023-07-03 ENCOUNTER — Other Ambulatory Visit (HOSPITAL_COMMUNITY): Payer: Self-pay

## 2023-07-03 MED ORDER — FLUTICASONE-SALMETEROL 100-50 MCG/ACT IN AEPB
1.0000 | INHALATION_SPRAY | Freq: Two times a day (BID) | RESPIRATORY_TRACT | 3 refills | Status: AC
  Filled 2023-07-03: qty 60, 30d supply, fill #0

## 2023-07-10 ENCOUNTER — Other Ambulatory Visit (HOSPITAL_COMMUNITY): Payer: Self-pay

## 2023-07-13 ENCOUNTER — Other Ambulatory Visit (HOSPITAL_COMMUNITY): Payer: Self-pay

## 2023-07-13 MED ORDER — AZITHROMYCIN 500 MG PO TABS
500.0000 mg | ORAL_TABLET | ORAL | 2 refills | Status: DC
  Filled 2023-07-13: qty 10, 70d supply, fill #0
  Filled 2023-09-23: qty 10, 70d supply, fill #1

## 2023-07-13 MED ORDER — BROMFENAC SODIUM 0.07 % OP SOLN
OPHTHALMIC | 3 refills | Status: DC
  Filled 2023-07-13: qty 3, 16d supply, fill #0
  Filled 2023-07-14: qty 3, 25d supply, fill #0
  Filled 2023-09-01 – 2023-09-02 (×2): qty 3, 25d supply, fill #1
  Filled 2023-11-17: qty 3, 25d supply, fill #2
  Filled 2023-12-01 – 2023-12-08 (×2): qty 3, 25d supply, fill #3

## 2023-07-14 ENCOUNTER — Other Ambulatory Visit: Payer: Self-pay

## 2023-07-14 ENCOUNTER — Other Ambulatory Visit (HOSPITAL_COMMUNITY): Payer: Self-pay

## 2023-07-15 ENCOUNTER — Other Ambulatory Visit (HOSPITAL_COMMUNITY): Payer: Self-pay

## 2023-07-15 DIAGNOSIS — L814 Other melanin hyperpigmentation: Secondary | ICD-10-CM | POA: Diagnosis not present

## 2023-07-15 DIAGNOSIS — D1801 Hemangioma of skin and subcutaneous tissue: Secondary | ICD-10-CM | POA: Diagnosis not present

## 2023-07-15 DIAGNOSIS — D0362 Melanoma in situ of left upper limb, including shoulder: Secondary | ICD-10-CM | POA: Diagnosis not present

## 2023-07-15 DIAGNOSIS — D485 Neoplasm of uncertain behavior of skin: Secondary | ICD-10-CM | POA: Diagnosis not present

## 2023-07-15 DIAGNOSIS — D225 Melanocytic nevi of trunk: Secondary | ICD-10-CM | POA: Diagnosis not present

## 2023-07-15 DIAGNOSIS — L820 Inflamed seborrheic keratosis: Secondary | ICD-10-CM | POA: Diagnosis not present

## 2023-07-15 DIAGNOSIS — L821 Other seborrheic keratosis: Secondary | ICD-10-CM | POA: Diagnosis not present

## 2023-07-15 DIAGNOSIS — L111 Transient acantholytic dermatosis [Grover]: Secondary | ICD-10-CM | POA: Diagnosis not present

## 2023-07-15 DIAGNOSIS — L565 Disseminated superficial actinic porokeratosis (DSAP): Secondary | ICD-10-CM | POA: Diagnosis not present

## 2023-08-06 ENCOUNTER — Other Ambulatory Visit (HOSPITAL_COMMUNITY): Payer: Self-pay

## 2023-08-06 DIAGNOSIS — M545 Low back pain, unspecified: Secondary | ICD-10-CM | POA: Diagnosis not present

## 2023-08-06 MED ORDER — PREDNISONE 10 MG PO TABS
ORAL_TABLET | ORAL | 0 refills | Status: AC
  Filled 2023-08-06: qty 20, 8d supply, fill #0

## 2023-08-24 DIAGNOSIS — M545 Low back pain, unspecified: Secondary | ICD-10-CM | POA: Diagnosis not present

## 2023-08-27 ENCOUNTER — Other Ambulatory Visit (HOSPITAL_COMMUNITY): Payer: Self-pay

## 2023-08-27 DIAGNOSIS — D0362 Melanoma in situ of left upper limb, including shoulder: Secondary | ICD-10-CM | POA: Diagnosis not present

## 2023-08-27 MED ORDER — MUPIROCIN 2 % EX OINT
1.0000 | TOPICAL_OINTMENT | Freq: Every day | CUTANEOUS | 0 refills | Status: DC
  Filled 2023-08-27: qty 22, 10d supply, fill #0

## 2023-08-31 DIAGNOSIS — M545 Low back pain, unspecified: Secondary | ICD-10-CM | POA: Diagnosis not present

## 2023-09-01 ENCOUNTER — Other Ambulatory Visit (HOSPITAL_COMMUNITY): Payer: Self-pay

## 2023-09-01 ENCOUNTER — Other Ambulatory Visit: Payer: Self-pay

## 2023-09-02 ENCOUNTER — Other Ambulatory Visit (HOSPITAL_COMMUNITY): Payer: Self-pay

## 2023-09-03 ENCOUNTER — Other Ambulatory Visit: Payer: Self-pay

## 2023-09-07 DIAGNOSIS — M545 Low back pain, unspecified: Secondary | ICD-10-CM | POA: Diagnosis not present

## 2023-09-14 DIAGNOSIS — M545 Low back pain, unspecified: Secondary | ICD-10-CM | POA: Diagnosis not present

## 2023-09-23 ENCOUNTER — Other Ambulatory Visit: Payer: Self-pay

## 2023-09-24 ENCOUNTER — Other Ambulatory Visit (HOSPITAL_COMMUNITY): Payer: Self-pay

## 2023-09-24 DIAGNOSIS — M47816 Spondylosis without myelopathy or radiculopathy, lumbar region: Secondary | ICD-10-CM | POA: Diagnosis not present

## 2023-09-24 MED ORDER — PREDNISONE 10 MG PO TABS
ORAL_TABLET | ORAL | 0 refills | Status: AC
  Filled 2023-09-24: qty 20, 8d supply, fill #0

## 2023-10-01 ENCOUNTER — Other Ambulatory Visit (HOSPITAL_COMMUNITY): Payer: Self-pay

## 2023-10-16 DIAGNOSIS — M545 Low back pain, unspecified: Secondary | ICD-10-CM | POA: Diagnosis not present

## 2023-10-20 ENCOUNTER — Encounter: Payer: Self-pay | Admitting: Allergy & Immunology

## 2023-10-20 ENCOUNTER — Ambulatory Visit: Payer: Commercial Managed Care - PPO | Admitting: Allergy & Immunology

## 2023-10-20 ENCOUNTER — Other Ambulatory Visit: Payer: Self-pay

## 2023-10-20 VITALS — BP 118/68 | HR 65 | Temp 98.0°F | Resp 16

## 2023-10-20 DIAGNOSIS — J339 Nasal polyp, unspecified: Secondary | ICD-10-CM

## 2023-10-20 DIAGNOSIS — J310 Chronic rhinitis: Secondary | ICD-10-CM | POA: Diagnosis not present

## 2023-10-20 NOTE — Progress Notes (Unsigned)
 FOLLOW UP  Date of Service/Encounter:  10/20/23   Assessment:   Chronic rhinitis - with negative blood and skin testing   Nasal polyps - left sided per rhinoscopy   Complicated past medical history including hypothyroidism, SVTs, and multiple melanocytic nevi   At this point, Jessica Herrera is doing very well with just a budesonide nasal rinses.  I do not see any reason to advance to Dupixent and she is not excited about the prospect either, so we will just continue with what we are doing.  I did ask her to contact us if she changes her mind and we can go ahead and get Dupixent started.  She was already approved for it.  Plan/Recommendations:   1. Chronic rhinitis - Testing was essentially negative with the blood work.  - I will not be surprised if this is negative given your history. - We will call you in 1-2 weeks with the results of the testing.   2. Nasal polyps - I think you are well controlled.  - We will continue with budesonide nasal rinses as you are doing.   3. Return in about 1 year (around 10/19/2024).   Subjective:   Jessica Herrera is a 68 y.o. female presenting today for follow up of  Chief Complaint  Patient presents with   Nasal Polyps   Allergic Rhinitis     Ashawnti Tangen has a history of the following: Patient Active Problem List   Diagnosis Date Noted   Essential hypertension 03/22/2021   Hyperlipidemia 03/22/2021   Dyspnea on exertion 03/22/2021   LLQ abdominal pain 02/18/2018   Achilles tendinitis, left leg 03/31/2017   Metatarsal stress fracture, right, initial encounter 03/02/2017   Heart palpitations, with dizziness 04/07/2013   B12 DEFICIENCY 02/08/2010   LIVER HEMANGIOMA 01/31/2010   LIVER MASS 01/31/2010   HYPOTHYROIDISM 01/31/2010   DIVERTICULOSIS OF COLON 01/31/2010    History obtained from: chart review and patient.  Discussed the use of AI scribe software for clinical note transcription with the patient and/or guardian,  who gave verbal consent to proceed.  Jessica Herrera is a 68 y.o. female presenting for a follow up visit.  We last saw her in September 2024.  At that time, we get some labs to look for environmental allergies.  For her nasal polyps, we talked her about Dupixent.  We started her on budesonide nasal rinses.  Since last visit, she has done very well.  She has experienced improvement in her nasal polyps, attributing this to the nightly use of a budesonide rinse, which has made her nasal passages much clearer. She has not started Dupixent, as she has only had two significant episodes in the past and feels she is currently doing well.  Her last visit with Dr. Pollyann Kennedy was in August 2024.  She does not have a follow-up visit scheduled.  She does not take any antihistamines and has no seasonal allergies, despite most of her family having them. Her blood work was essentially negative, with only a minor mold allergy noted, which she considers insignificant.  She has a history of a detached retina and uses steroid eye drops to maintain her vision. Her husband, a Community education officer, is involved in her care. She has a wrinkle in her retina, and she is trying to avoid surgery.  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 She stays busy with her grandchildren, some of whom live nearby, and her 64 year old mother, who resides in a  local assisted living facility. She frequently travels to family properties in [place] and [place].      Component     Latest Ref Rng 04/21/2023  IgE (Immunoglobulin E), Serum     6 - 495 IU/mL 21   D Pteronyssinus IgE     Class 0 kU/L <0.10   D Farinae IgE     Class 0 kU/L <0.10   Cat Dander IgE     Class 0 kU/L <0.10   Dog Dander IgE     Class 0 kU/L <0.10   [place] Grass IgE     Class 0 kU/L <0.10   Timothy Grass IgE     Class 0 kU/L <0.10   [place] Grass IgE     Class 0 kU/L <0.10   Cockroach, German IgE     Class 0 kU/L <0.10   Penicillium Chrysogen  IgE     Class 0 kU/L <0.10   Cladosporium Herbarum IgE     Class 0 kU/L <0.10   Aspergillus Fumigatus IgE     Class 0 kU/L <0.10   Alternaria Alternata IgE     Class 0 kU/L <0.10   Maple/Box Elder IgE     Class 0 kU/L <0.10   Common Silver Charletta Cousin IgE     Class 0 kU/L <0.10   Cedar, [place] IgE     Class 0 kU/L <0.10   Oak, White IgE     Class 0 kU/L <0.10   Elm, American IgE     Class 0 kU/L <0.10   C[place]wood IgE     Class 0 kU/L <0.10   Pecan, Hickory IgE     Class 0 kU/L <0.10   White Mulberry IgE     Class 0 kU/L <0.10   Ragweed, Short IgE     Class 0 kU/L <0.10   Pigweed, Rough IgE     Class 0 kU/L <0.10   Sheep Sorrel IgE Qn     Class 0 kU/L <0.10   Mouse Urine IgE     Class 0 kU/L <0.10    Component     Latest Ref Rng 04/21/2023  Mucor Racemosus IgE     Class 0 kU/L <0.10   Candida Albicans IgE     Class 0/I kU/L 0.14 !   Setomelanomma Rostrat     Class 0 kU/L <0.10   M009-IgE Fusarium proliferatum     Class 0 kU/L <0.10   Stemphylium Herbarum IgE     Class 0 kU/L <0.10   Aureobasidi Pullulans IgE     Class 0 kU/L <0.10   Phoma Betae IgE     Class 0 kU/L <0.10   M014-IgE Epicoccum purpur     Class 0 kU/L <0.10      Otherwise, there have been no changes to her past medical history, surgical history, family history, or social history.    Review of systems otherwise negative other than that mentioned in the HPI.    Objective:   Blood pressure 118/68, pulse 65, temperature 98 F (36.7 C), temperature source Temporal, resp. rate 16, SpO2 96%. There is no height or weight on file to calculate BMI.    Physical Exam Vitals reviewed.  Constitutional:      Appearance: She is well-developed.     Comments: Lovely.  Pleasant.  HENT:     Head: Normocephalic and atraumatic.     Right Ear: Tympanic membrane, ear canal and external ear normal. No drainage, swelling or tenderness. Tympanic  membrane is not injected, scarred, erythematous, retracted or  bulging.     Left Ear: Tympanic membrane, ear canal and external ear normal. No drainage, swelling or tenderness. Tympanic membrane is not injected, scarred, erythematous, retracted or bulging.     Nose: No nasal deformity, septal deviation, mucosal edema or rhinorrhea.     Right Turbinates: Enlarged, swollen and pale.     Left Turbinates: Enlarged, swollen and pale.     Right Sinus: No maxillary sinus tenderness or frontal sinus tenderness.     Left Sinus: No maxillary sinus tenderness or frontal sinus tenderness.     Comments: Enlarged turbinates bilaterally.  Scant polyps on the left side.  Overall, it does look smaller than the last time we saw her.    Mouth/Throat:     Lips: Pink.     Mouth: Mucous membranes are moist. Mucous membranes are not pale and not dry.     Pharynx: Uvula midline.  Eyes:     General: Lids are normal. Allergic shiner present.        Right eye: No discharge or hordeolum.        Left eye: No discharge.     Conjunctiva/sclera: Conjunctivae normal.     Right eye: Right conjunctiva is not injected. No chemosis.    Left eye: Left conjunctiva is not injected. No chemosis.    Pupils: Pupils are equal, round, and reactive to light.  Cardiovascular:     Rate and Rhythm: Normal rate and regular rhythm.     Heart sounds: Normal heart sounds.  Pulmonary:     Effort: Pulmonary effort is normal. No tachypnea, accessory muscle usage or respiratory distress.     Breath sounds: Normal breath sounds. No wheezing, rhonchi or rales.  Chest:     Chest wall: No tenderness.  Abdominal:     Tenderness: There is no abdominal tenderness. There is no guarding or rebound.  Lymphadenopathy:     Head:     Right side of head: No submandibular, tonsillar or occipital adenopathy.     Left side of head: No submandibular, tonsillar or occipital adenopathy.     Cervical: No cervical adenopathy.  Skin:    Coloration: Skin is not pale.     Findings: No abrasion, erythema, petechiae or  rash. Rash is not papular, urticarial or vesicular.  Neurological:     Mental Status: She is alert.  Psychiatric:        Behavior: Behavior is cooperative.      Diagnostic studies: none       Malachi Bonds, MD  [facility]

## 2023-10-20 NOTE — Patient Instructions (Addendum)
 1. Chronic rhinitis - Testing was essentially negative with the blood work.  - I will not be surprised if this is negative given your history. - We will call you in 1-2 weeks with the results of the testing.   2. Nasal polyps - I think you are well controlled.  - We will continue with budesonide nasal rinses as you are doing.   3. Return in about 1 year (around 10/19/2024).    Please inform us of any Emergency Department visits, hospitalizations, or changes in symptoms. Call us before going to the ED for breathing or allergy symptoms since we might be able to fit you in for a sick visit. Feel free to contact us anytime with any questions, problems, or concerns.  It was a pleasure to see you again today!  Websites that have reliable patient information: 1. American Academy of Asthma, Allergy, and Immunology: www.aaaai.org 2. Food Allergy Research and Education (FARE): foodallergy.org 3. Mothers of Asthmatics: http://www.asthmacommunitynetwork.org 4. American College of Allergy, Asthma, and Immunology: www.acaai.org      "Like" [place] on Facebook and Instagram for our latest updates!      A healthy democracy works best when [employer] participate! Make sure you are registered to vote! If you have moved or changed any of your contact information, you will need to get this updated before voting! Scan the QR codes below to learn more!

## 2023-10-22 ENCOUNTER — Encounter: Payer: Self-pay | Admitting: Allergy & Immunology

## 2023-10-23 DIAGNOSIS — M47896 Other spondylosis, lumbar region: Secondary | ICD-10-CM | POA: Diagnosis not present

## 2023-11-03 DIAGNOSIS — K635 Polyp of colon: Secondary | ICD-10-CM | POA: Diagnosis not present

## 2023-11-03 DIAGNOSIS — E538 Deficiency of other specified B group vitamins: Secondary | ICD-10-CM | POA: Diagnosis not present

## 2023-11-03 DIAGNOSIS — I38 Endocarditis, valve unspecified: Secondary | ICD-10-CM | POA: Diagnosis not present

## 2023-11-03 DIAGNOSIS — R1030 Lower abdominal pain, unspecified: Secondary | ICD-10-CM | POA: Diagnosis not present

## 2023-11-03 DIAGNOSIS — D1803 Hemangioma of intra-abdominal structures: Secondary | ICD-10-CM | POA: Diagnosis not present

## 2023-11-03 DIAGNOSIS — E039 Hypothyroidism, unspecified: Secondary | ICD-10-CM | POA: Diagnosis not present

## 2023-11-03 DIAGNOSIS — M545 Low back pain, unspecified: Secondary | ICD-10-CM | POA: Diagnosis not present

## 2023-11-03 DIAGNOSIS — E785 Hyperlipidemia, unspecified: Secondary | ICD-10-CM | POA: Diagnosis not present

## 2023-11-03 DIAGNOSIS — R002 Palpitations: Secondary | ICD-10-CM | POA: Diagnosis not present

## 2023-11-03 DIAGNOSIS — I10 Essential (primary) hypertension: Secondary | ICD-10-CM | POA: Diagnosis not present

## 2023-11-05 DIAGNOSIS — M47816 Spondylosis without myelopathy or radiculopathy, lumbar region: Secondary | ICD-10-CM | POA: Diagnosis not present

## 2023-11-17 ENCOUNTER — Other Ambulatory Visit (HOSPITAL_COMMUNITY): Payer: Self-pay

## 2023-11-17 ENCOUNTER — Other Ambulatory Visit: Payer: Self-pay

## 2023-11-18 ENCOUNTER — Other Ambulatory Visit (HOSPITAL_COMMUNITY): Payer: Self-pay

## 2023-11-26 DIAGNOSIS — M533 Sacrococcygeal disorders, not elsewhere classified: Secondary | ICD-10-CM | POA: Diagnosis not present

## 2023-11-26 DIAGNOSIS — M47896 Other spondylosis, lumbar region: Secondary | ICD-10-CM | POA: Diagnosis not present

## 2023-12-01 ENCOUNTER — Other Ambulatory Visit: Payer: Self-pay

## 2023-12-01 ENCOUNTER — Other Ambulatory Visit (HOSPITAL_COMMUNITY): Payer: Self-pay

## 2023-12-03 DIAGNOSIS — M533 Sacrococcygeal disorders, not elsewhere classified: Secondary | ICD-10-CM | POA: Diagnosis not present

## 2023-12-08 ENCOUNTER — Other Ambulatory Visit: Payer: Self-pay

## 2023-12-08 ENCOUNTER — Other Ambulatory Visit (HOSPITAL_COMMUNITY): Payer: Self-pay

## 2023-12-09 ENCOUNTER — Other Ambulatory Visit (HOSPITAL_COMMUNITY): Payer: Self-pay

## 2023-12-28 ENCOUNTER — Other Ambulatory Visit (HOSPITAL_COMMUNITY): Payer: Self-pay

## 2024-01-22 ENCOUNTER — Other Ambulatory Visit (HOSPITAL_COMMUNITY): Payer: Self-pay

## 2024-01-28 ENCOUNTER — Other Ambulatory Visit (HOSPITAL_COMMUNITY): Payer: Self-pay

## 2024-02-08 ENCOUNTER — Other Ambulatory Visit: Payer: Self-pay | Admitting: Obstetrics and Gynecology

## 2024-02-08 DIAGNOSIS — Z1231 Encounter for screening mammogram for malignant neoplasm of breast: Secondary | ICD-10-CM

## 2024-02-15 ENCOUNTER — Other Ambulatory Visit (HOSPITAL_COMMUNITY): Payer: Self-pay

## 2024-02-16 DIAGNOSIS — L821 Other seborrheic keratosis: Secondary | ICD-10-CM | POA: Diagnosis not present

## 2024-02-16 DIAGNOSIS — D2261 Melanocytic nevi of right upper limb, including shoulder: Secondary | ICD-10-CM | POA: Diagnosis not present

## 2024-02-16 DIAGNOSIS — D225 Melanocytic nevi of trunk: Secondary | ICD-10-CM | POA: Diagnosis not present

## 2024-02-16 DIAGNOSIS — L738 Other specified follicular disorders: Secondary | ICD-10-CM | POA: Diagnosis not present

## 2024-02-16 DIAGNOSIS — Z85820 Personal history of malignant melanoma of skin: Secondary | ICD-10-CM | POA: Diagnosis not present

## 2024-02-16 DIAGNOSIS — L905 Scar conditions and fibrosis of skin: Secondary | ICD-10-CM | POA: Diagnosis not present

## 2024-02-29 ENCOUNTER — Ambulatory Visit
Admission: RE | Admit: 2024-02-29 | Discharge: 2024-02-29 | Disposition: A | Discharge status: Home / Self Care | Source: Ambulatory Visit | Attending: Obstetrics and Gynecology | Admitting: Obstetrics and Gynecology

## 2024-02-29 DIAGNOSIS — Z1231 Encounter for screening mammogram for malignant neoplasm of breast: Secondary | ICD-10-CM

## 2024-03-03 ENCOUNTER — Other Ambulatory Visit: Payer: Self-pay | Admitting: Obstetrics and Gynecology

## 2024-03-03 DIAGNOSIS — R928 Other abnormal and inconclusive findings on diagnostic imaging of breast: Secondary | ICD-10-CM

## 2024-03-04 DIAGNOSIS — M5416 Radiculopathy, lumbar region: Secondary | ICD-10-CM | POA: Diagnosis not present

## 2024-03-04 DIAGNOSIS — M47896 Other spondylosis, lumbar region: Secondary | ICD-10-CM | POA: Diagnosis not present

## 2024-03-04 DIAGNOSIS — M533 Sacrococcygeal disorders, not elsewhere classified: Secondary | ICD-10-CM | POA: Diagnosis not present

## 2024-03-09 DIAGNOSIS — M5416 Radiculopathy, lumbar region: Secondary | ICD-10-CM | POA: Diagnosis not present

## 2024-03-10 ENCOUNTER — Ambulatory Visit
Admission: RE | Admit: 2024-03-10 | Discharge: 2024-03-10 | Disposition: A | Discharge status: Home / Self Care | Source: Ambulatory Visit | Attending: Obstetrics and Gynecology | Admitting: Obstetrics and Gynecology

## 2024-03-10 ENCOUNTER — Ambulatory Visit

## 2024-03-10 DIAGNOSIS — R928 Other abnormal and inconclusive findings on diagnostic imaging of breast: Secondary | ICD-10-CM | POA: Diagnosis not present

## 2024-03-11 ENCOUNTER — Other Ambulatory Visit (HOSPITAL_COMMUNITY): Payer: Self-pay

## 2024-03-18 ENCOUNTER — Other Ambulatory Visit (HOSPITAL_COMMUNITY): Payer: Self-pay

## 2024-03-21 ENCOUNTER — Other Ambulatory Visit (HOSPITAL_COMMUNITY): Payer: Self-pay

## 2024-03-22 ENCOUNTER — Other Ambulatory Visit (HOSPITAL_COMMUNITY): Payer: Self-pay

## 2024-03-22 MED ORDER — BROMFENAC SODIUM 0.07 % OP SOLN
3.0000 [drp] | OPHTHALMIC | 3 refills | Status: AC
  Filled 2024-03-22: qty 3, 10d supply, fill #0
  Filled 2024-07-08 – 2024-07-14 (×3): qty 3, 10d supply, fill #1
  Filled 2024-08-25: qty 3, 10d supply, fill #2

## 2024-03-23 ENCOUNTER — Other Ambulatory Visit (HOSPITAL_COMMUNITY): Payer: Self-pay

## 2024-03-23 MED ORDER — SYNTHROID 100 MCG PO TABS
100.0000 ug | ORAL_TABLET | Freq: Every day | ORAL | 3 refills | Status: AC
  Filled 2024-03-23: qty 90, 90d supply, fill #0
  Filled 2024-06-20: qty 90, 90d supply, fill #1

## 2024-03-24 ENCOUNTER — Other Ambulatory Visit: Payer: Self-pay

## 2024-03-24 ENCOUNTER — Other Ambulatory Visit (HOSPITAL_COMMUNITY): Payer: Self-pay

## 2024-04-01 ENCOUNTER — Other Ambulatory Visit (HOSPITAL_COMMUNITY): Payer: Self-pay

## 2024-04-01 DIAGNOSIS — M47816 Spondylosis without myelopathy or radiculopathy, lumbar region: Secondary | ICD-10-CM | POA: Diagnosis not present

## 2024-04-01 DIAGNOSIS — M533 Sacrococcygeal disorders, not elsewhere classified: Secondary | ICD-10-CM | POA: Diagnosis not present

## 2024-04-01 DIAGNOSIS — M5416 Radiculopathy, lumbar region: Secondary | ICD-10-CM | POA: Diagnosis not present

## 2024-04-01 MED ORDER — PREDNISONE 10 MG PO TABS
10.0000 mg | ORAL_TABLET | Freq: Every day | ORAL | 0 refills | Status: AC
  Filled 2024-04-01: qty 30, 30d supply, fill #0

## 2024-04-04 ENCOUNTER — Other Ambulatory Visit (HOSPITAL_COMMUNITY): Payer: Self-pay

## 2024-04-04 ENCOUNTER — Other Ambulatory Visit: Payer: Self-pay

## 2024-04-05 ENCOUNTER — Other Ambulatory Visit (HOSPITAL_COMMUNITY): Payer: Self-pay

## 2024-04-05 ENCOUNTER — Other Ambulatory Visit: Payer: Self-pay

## 2024-04-21 DIAGNOSIS — M4310 Spondylolisthesis, site unspecified: Secondary | ICD-10-CM | POA: Diagnosis not present

## 2024-04-21 DIAGNOSIS — M47816 Spondylosis without myelopathy or radiculopathy, lumbar region: Secondary | ICD-10-CM | POA: Diagnosis not present

## 2024-04-28 DIAGNOSIS — E039 Hypothyroidism, unspecified: Secondary | ICD-10-CM | POA: Diagnosis not present

## 2024-04-28 DIAGNOSIS — D649 Anemia, unspecified: Secondary | ICD-10-CM | POA: Diagnosis not present

## 2024-04-28 DIAGNOSIS — I10 Essential (primary) hypertension: Secondary | ICD-10-CM | POA: Diagnosis not present

## 2024-04-28 DIAGNOSIS — Z1212 Encounter for screening for malignant neoplasm of rectum: Secondary | ICD-10-CM | POA: Diagnosis not present

## 2024-04-28 DIAGNOSIS — E785 Hyperlipidemia, unspecified: Secondary | ICD-10-CM | POA: Diagnosis not present

## 2024-04-28 DIAGNOSIS — E538 Deficiency of other specified B group vitamins: Secondary | ICD-10-CM | POA: Diagnosis not present

## 2024-05-05 DIAGNOSIS — M545 Low back pain, unspecified: Secondary | ICD-10-CM | POA: Diagnosis not present

## 2024-05-05 DIAGNOSIS — I10 Essential (primary) hypertension: Secondary | ICD-10-CM | POA: Diagnosis not present

## 2024-05-05 DIAGNOSIS — I700 Atherosclerosis of aorta: Secondary | ICD-10-CM | POA: Diagnosis not present

## 2024-05-05 DIAGNOSIS — I38 Endocarditis, valve unspecified: Secondary | ICD-10-CM | POA: Diagnosis not present

## 2024-05-05 DIAGNOSIS — Z23 Encounter for immunization: Secondary | ICD-10-CM | POA: Diagnosis not present

## 2024-05-05 DIAGNOSIS — C4362 Malignant melanoma of left upper limb, including shoulder: Secondary | ICD-10-CM | POA: Diagnosis not present

## 2024-05-05 DIAGNOSIS — E039 Hypothyroidism, unspecified: Secondary | ICD-10-CM | POA: Diagnosis not present

## 2024-05-05 DIAGNOSIS — R82998 Other abnormal findings in urine: Secondary | ICD-10-CM | POA: Diagnosis not present

## 2024-05-05 DIAGNOSIS — E785 Hyperlipidemia, unspecified: Secondary | ICD-10-CM | POA: Diagnosis not present

## 2024-05-05 DIAGNOSIS — J339 Nasal polyp, unspecified: Secondary | ICD-10-CM | POA: Diagnosis not present

## 2024-05-05 DIAGNOSIS — Z0000 Encounter for general adult medical examination without abnormal findings: Secondary | ICD-10-CM | POA: Diagnosis not present

## 2024-05-18 ENCOUNTER — Other Ambulatory Visit: Payer: Self-pay | Admitting: Allergy & Immunology

## 2024-05-19 ENCOUNTER — Other Ambulatory Visit (HOSPITAL_COMMUNITY): Payer: Self-pay

## 2024-05-19 DIAGNOSIS — M47816 Spondylosis without myelopathy or radiculopathy, lumbar region: Secondary | ICD-10-CM | POA: Diagnosis not present

## 2024-05-19 MED ORDER — BUDESONIDE 0.5 MG/2ML IN SUSP
RESPIRATORY_TRACT | 5 refills | Status: AC
  Filled 2024-05-19: qty 120, 30d supply, fill #0
  Filled 2024-07-29: qty 120, 30d supply, fill #1

## 2024-05-23 DIAGNOSIS — Z6825 Body mass index (BMI) 25.0-25.9, adult: Secondary | ICD-10-CM | POA: Diagnosis not present

## 2024-05-23 DIAGNOSIS — Z1151 Encounter for screening for human papillomavirus (HPV): Secondary | ICD-10-CM | POA: Diagnosis not present

## 2024-05-23 DIAGNOSIS — Z124 Encounter for screening for malignant neoplasm of cervix: Secondary | ICD-10-CM | POA: Diagnosis not present

## 2024-05-23 DIAGNOSIS — Z01419 Encounter for gynecological examination (general) (routine) without abnormal findings: Secondary | ICD-10-CM | POA: Diagnosis not present

## 2024-05-24 ENCOUNTER — Other Ambulatory Visit: Payer: Self-pay | Admitting: Obstetrics and Gynecology

## 2024-05-24 DIAGNOSIS — Z800 Family history of malignant neoplasm of digestive organs: Secondary | ICD-10-CM

## 2024-05-25 ENCOUNTER — Other Ambulatory Visit: Payer: Self-pay | Admitting: Obstetrics and Gynecology

## 2024-05-25 DIAGNOSIS — Z9189 Other specified personal risk factors, not elsewhere classified: Secondary | ICD-10-CM

## 2024-06-02 DIAGNOSIS — M47816 Spondylosis without myelopathy or radiculopathy, lumbar region: Secondary | ICD-10-CM | POA: Diagnosis not present

## 2024-06-20 ENCOUNTER — Other Ambulatory Visit (HOSPITAL_COMMUNITY): Payer: Self-pay

## 2024-06-25 ENCOUNTER — Other Ambulatory Visit

## 2024-06-30 DIAGNOSIS — M47816 Spondylosis without myelopathy or radiculopathy, lumbar region: Secondary | ICD-10-CM | POA: Diagnosis not present

## 2024-07-04 ENCOUNTER — Other Ambulatory Visit

## 2024-07-08 ENCOUNTER — Other Ambulatory Visit (HOSPITAL_COMMUNITY): Payer: Self-pay

## 2024-07-14 ENCOUNTER — Other Ambulatory Visit (HOSPITAL_COMMUNITY): Payer: Self-pay

## 2024-07-14 DIAGNOSIS — L905 Scar conditions and fibrosis of skin: Secondary | ICD-10-CM | POA: Diagnosis not present

## 2024-07-14 DIAGNOSIS — L821 Other seborrheic keratosis: Secondary | ICD-10-CM | POA: Diagnosis not present

## 2024-07-14 DIAGNOSIS — D485 Neoplasm of uncertain behavior of skin: Secondary | ICD-10-CM | POA: Diagnosis not present

## 2024-07-14 DIAGNOSIS — L308 Other specified dermatitis: Secondary | ICD-10-CM | POA: Diagnosis not present

## 2024-07-14 DIAGNOSIS — Z85820 Personal history of malignant melanoma of skin: Secondary | ICD-10-CM | POA: Diagnosis not present

## 2024-07-14 DIAGNOSIS — D2239 Melanocytic nevi of other parts of face: Secondary | ICD-10-CM | POA: Diagnosis not present

## 2024-07-14 DIAGNOSIS — D220 Melanocytic nevi of lip: Secondary | ICD-10-CM | POA: Diagnosis not present

## 2024-07-14 DIAGNOSIS — L111 Transient acantholytic dermatosis [Grover]: Secondary | ICD-10-CM | POA: Diagnosis not present

## 2024-07-14 MED ORDER — TRIAMCINOLONE ACETONIDE 0.1 % EX CREA
1.0000 | TOPICAL_CREAM | Freq: Two times a day (BID) | CUTANEOUS | 0 refills | Status: AC
  Filled 2024-07-14: qty 15, 30d supply, fill #0
  Filled 2024-07-15: qty 454, 30d supply, fill #0

## 2024-07-15 ENCOUNTER — Other Ambulatory Visit (HOSPITAL_COMMUNITY): Payer: Self-pay

## 2024-07-29 ENCOUNTER — Other Ambulatory Visit (HOSPITAL_COMMUNITY): Payer: Self-pay

## 2024-07-29 MED ORDER — LOSARTAN POTASSIUM 25 MG PO TABS
25.0000 mg | ORAL_TABLET | Freq: Every day | ORAL | 4 refills | Status: AC
  Filled 2024-07-29 – 2024-08-24 (×4): qty 90, 90d supply, fill #0

## 2024-07-29 MED ORDER — ATORVASTATIN CALCIUM 10 MG PO TABS
10.0000 mg | ORAL_TABLET | Freq: Every day | ORAL | 4 refills | Status: AC
  Filled 2024-07-29 – 2024-08-24 (×4): qty 90, 90d supply, fill #0

## 2024-08-01 ENCOUNTER — Other Ambulatory Visit

## 2024-08-01 DIAGNOSIS — Z9189 Other specified personal risk factors, not elsewhere classified: Secondary | ICD-10-CM

## 2024-08-01 MED ORDER — GADOPICLENOL 0.5 MMOL/ML IV SOLN
7.0000 mL | Freq: Once | INTRAVENOUS | Status: AC | PRN
  Administered 2024-08-01: 7 mL via INTRAVENOUS

## 2024-08-02 ENCOUNTER — Other Ambulatory Visit (HOSPITAL_COMMUNITY): Payer: Self-pay

## 2024-08-23 ENCOUNTER — Other Ambulatory Visit (HOSPITAL_BASED_OUTPATIENT_CLINIC_OR_DEPARTMENT_OTHER): Payer: Self-pay

## 2024-08-23 ENCOUNTER — Other Ambulatory Visit: Payer: Self-pay

## 2024-08-24 ENCOUNTER — Other Ambulatory Visit: Payer: Self-pay

## 2024-08-24 ENCOUNTER — Other Ambulatory Visit (HOSPITAL_BASED_OUTPATIENT_CLINIC_OR_DEPARTMENT_OTHER): Payer: Self-pay

## 2024-08-24 ENCOUNTER — Other Ambulatory Visit (HOSPITAL_COMMUNITY): Payer: Self-pay

## 2024-10-18 ENCOUNTER — Ambulatory Visit: Admitting: Allergy & Immunology
